# Patient Record
Sex: Female | Born: 1935 | Race: Black or African American | Hispanic: No | Smoking: Never smoker
Health system: Southern US, Community
[De-identification: ages and names within clinical notes are randomized; demographics above are authoritative.]

## PROBLEM LIST (undated history)

## (undated) DIAGNOSIS — F32A Depression, unspecified: Secondary | ICD-10-CM

## (undated) DIAGNOSIS — I10 Essential (primary) hypertension: Secondary | ICD-10-CM

## (undated) DIAGNOSIS — E785 Hyperlipidemia, unspecified: Secondary | ICD-10-CM

## (undated) DIAGNOSIS — F329 Major depressive disorder, single episode, unspecified: Secondary | ICD-10-CM

## (undated) DIAGNOSIS — C801 Malignant (primary) neoplasm, unspecified: Secondary | ICD-10-CM

## (undated) DIAGNOSIS — M1990 Unspecified osteoarthritis, unspecified site: Secondary | ICD-10-CM

---

## 1999-05-02 ENCOUNTER — Ambulatory Visit (HOSPITAL_COMMUNITY): Admission: RE | Admit: 1999-05-02 | Discharge: 1999-05-02 | Payer: Self-pay | Admitting: Family Medicine

## 1999-05-02 ENCOUNTER — Encounter: Payer: Self-pay | Admitting: Family Medicine

## 2000-12-11 ENCOUNTER — Other Ambulatory Visit: Admission: RE | Admit: 2000-12-11 | Discharge: 2000-12-11 | Payer: Self-pay | Admitting: Family Medicine

## 2000-12-24 ENCOUNTER — Ambulatory Visit (HOSPITAL_COMMUNITY): Admission: RE | Admit: 2000-12-24 | Discharge: 2000-12-24 | Payer: Self-pay | Admitting: Family Medicine

## 2001-08-18 ENCOUNTER — Ambulatory Visit (HOSPITAL_COMMUNITY): Admission: RE | Admit: 2001-08-18 | Discharge: 2001-08-18 | Payer: Self-pay | Admitting: Family Medicine

## 2002-05-29 ENCOUNTER — Emergency Department (HOSPITAL_COMMUNITY): Admission: EM | Admit: 2002-05-29 | Discharge: 2002-05-29 | Payer: Self-pay | Admitting: Emergency Medicine

## 2003-01-31 ENCOUNTER — Encounter (INDEPENDENT_AMBULATORY_CARE_PROVIDER_SITE_OTHER): Payer: Self-pay | Admitting: Family Medicine

## 2003-01-31 ENCOUNTER — Other Ambulatory Visit: Admission: RE | Admit: 2003-01-31 | Discharge: 2003-01-31 | Payer: Self-pay | Admitting: Family Medicine

## 2003-01-31 LAB — CONVERTED CEMR LAB: Pap Smear: NORMAL

## 2003-02-14 ENCOUNTER — Ambulatory Visit (HOSPITAL_COMMUNITY): Admission: RE | Admit: 2003-02-14 | Discharge: 2003-02-14 | Payer: Self-pay | Admitting: Family Medicine

## 2003-04-08 ENCOUNTER — Emergency Department (HOSPITAL_COMMUNITY): Admission: EM | Admit: 2003-04-08 | Discharge: 2003-04-08 | Payer: Self-pay | Admitting: Emergency Medicine

## 2003-11-22 ENCOUNTER — Ambulatory Visit: Payer: Self-pay | Admitting: Family Medicine

## 2004-02-22 ENCOUNTER — Ambulatory Visit: Payer: Self-pay | Admitting: Family Medicine

## 2004-08-29 ENCOUNTER — Ambulatory Visit: Payer: Self-pay | Admitting: Family Medicine

## 2005-03-05 ENCOUNTER — Ambulatory Visit: Payer: Self-pay | Admitting: Family Medicine

## 2005-04-03 ENCOUNTER — Ambulatory Visit: Payer: Self-pay | Admitting: Family Medicine

## 2005-04-09 ENCOUNTER — Ambulatory Visit: Payer: Self-pay | Admitting: Family Medicine

## 2005-04-25 ENCOUNTER — Ambulatory Visit: Payer: Self-pay | Admitting: Family Medicine

## 2005-05-19 ENCOUNTER — Ambulatory Visit: Payer: Self-pay | Admitting: Family Medicine

## 2005-05-29 ENCOUNTER — Encounter (INDEPENDENT_AMBULATORY_CARE_PROVIDER_SITE_OTHER): Payer: Self-pay | Admitting: Specialist

## 2005-05-29 ENCOUNTER — Ambulatory Visit (HOSPITAL_BASED_OUTPATIENT_CLINIC_OR_DEPARTMENT_OTHER): Admission: RE | Admit: 2005-05-29 | Discharge: 2005-05-29 | Payer: Self-pay | Admitting: Ophthalmology

## 2005-07-01 ENCOUNTER — Ambulatory Visit: Payer: Self-pay | Admitting: Family Medicine

## 2005-08-18 ENCOUNTER — Ambulatory Visit: Payer: Self-pay | Admitting: Family Medicine

## 2005-08-28 ENCOUNTER — Ambulatory Visit: Payer: Self-pay | Admitting: Family Medicine

## 2005-10-30 ENCOUNTER — Ambulatory Visit: Payer: Self-pay | Admitting: Family Medicine

## 2005-11-04 ENCOUNTER — Ambulatory Visit: Payer: Self-pay | Admitting: Family Medicine

## 2006-01-13 ENCOUNTER — Ambulatory Visit: Payer: Self-pay | Admitting: Family Medicine

## 2006-01-25 ENCOUNTER — Emergency Department (HOSPITAL_COMMUNITY): Admission: EM | Admit: 2006-01-25 | Discharge: 2006-01-25 | Payer: Self-pay | Admitting: Emergency Medicine

## 2006-02-12 ENCOUNTER — Ambulatory Visit: Payer: Self-pay | Admitting: Family Medicine

## 2006-03-19 ENCOUNTER — Ambulatory Visit: Payer: Self-pay | Admitting: Family Medicine

## 2006-07-11 ENCOUNTER — Encounter (INDEPENDENT_AMBULATORY_CARE_PROVIDER_SITE_OTHER): Payer: Self-pay | Admitting: Family Medicine

## 2006-07-11 DIAGNOSIS — I10 Essential (primary) hypertension: Secondary | ICD-10-CM | POA: Insufficient documentation

## 2006-07-11 DIAGNOSIS — F209 Schizophrenia, unspecified: Secondary | ICD-10-CM | POA: Insufficient documentation

## 2006-07-11 DIAGNOSIS — E119 Type 2 diabetes mellitus without complications: Secondary | ICD-10-CM

## 2006-07-11 DIAGNOSIS — E785 Hyperlipidemia, unspecified: Secondary | ICD-10-CM | POA: Insufficient documentation

## 2006-07-11 DIAGNOSIS — Z8719 Personal history of other diseases of the digestive system: Secondary | ICD-10-CM | POA: Insufficient documentation

## 2006-07-20 ENCOUNTER — Ambulatory Visit: Payer: Self-pay | Admitting: Family Medicine

## 2006-10-19 ENCOUNTER — Emergency Department (HOSPITAL_COMMUNITY): Admission: EM | Admit: 2006-10-19 | Discharge: 2006-10-19 | Payer: Self-pay | Admitting: Emergency Medicine

## 2006-10-22 ENCOUNTER — Ambulatory Visit: Payer: Self-pay | Admitting: Cardiology

## 2006-11-11 ENCOUNTER — Ambulatory Visit: Payer: Self-pay | Admitting: Family Medicine

## 2006-11-11 LAB — CONVERTED CEMR LAB
ALT: 13 units/L (ref 0–35)
Chloride: 104 meq/L (ref 96–112)
Creatinine, Ser: 0.93 mg/dL (ref 0.40–1.20)
Eosinophils Absolute: 0 10*3/uL (ref 0.0–0.7)
MCHC: 31.6 g/dL (ref 30.0–36.0)
MCV: 95 fL (ref 78.0–100.0)
Neutro Abs: 1.5 10*3/uL — ABNORMAL LOW (ref 1.7–7.7)
Neutrophils Relative %: 47 % (ref 43–77)
Potassium: 3.9 meq/L (ref 3.5–5.3)
RBC: 4.76 M/uL (ref 3.87–5.11)
Sodium: 142 meq/L (ref 135–145)
Total Protein: 7.1 g/dL (ref 6.0–8.3)
WBC: 3.2 10*3/uL — ABNORMAL LOW (ref 4.0–10.5)

## 2006-11-26 ENCOUNTER — Ambulatory Visit: Payer: Self-pay | Admitting: Family Medicine

## 2006-12-08 ENCOUNTER — Ambulatory Visit: Payer: Self-pay | Admitting: Cardiology

## 2006-12-08 ENCOUNTER — Ambulatory Visit: Payer: Self-pay

## 2006-12-08 LAB — CONVERTED CEMR LAB
Cholesterol: 230 mg/dL (ref 0–200)
HDL: 36.5 mg/dL — ABNORMAL LOW (ref >39.0)

## 2007-02-26 ENCOUNTER — Ambulatory Visit: Payer: Self-pay | Admitting: Family Medicine

## 2007-02-26 LAB — CONVERTED CEMR LAB
HDL: 48 mg/dL (ref >39)
LDL Cholesterol: 155 mg/dL — ABNORMAL HIGH (ref 0–99)
Triglycerides: 116 mg/dL (ref <150)

## 2007-09-29 ENCOUNTER — Emergency Department (HOSPITAL_COMMUNITY): Admission: EM | Admit: 2007-09-29 | Discharge: 2007-09-29 | Payer: Self-pay | Admitting: Family Medicine

## 2007-10-06 ENCOUNTER — Ambulatory Visit: Payer: Self-pay | Admitting: Internal Medicine

## 2007-10-08 ENCOUNTER — Ambulatory Visit (HOSPITAL_COMMUNITY): Admission: RE | Admit: 2007-10-08 | Discharge: 2007-10-08 | Payer: Self-pay | Admitting: Family Medicine

## 2008-01-18 ENCOUNTER — Ambulatory Visit: Payer: Self-pay | Admitting: Internal Medicine

## 2008-08-28 ENCOUNTER — Ambulatory Visit: Payer: Self-pay | Admitting: Internal Medicine

## 2008-08-30 ENCOUNTER — Emergency Department (HOSPITAL_COMMUNITY): Admission: EM | Admit: 2008-08-30 | Discharge: 2008-08-30 | Payer: Self-pay | Admitting: Emergency Medicine

## 2008-09-01 ENCOUNTER — Emergency Department (HOSPITAL_COMMUNITY): Admission: EM | Admit: 2008-09-01 | Discharge: 2008-09-01 | Payer: Self-pay | Admitting: Emergency Medicine

## 2008-09-04 ENCOUNTER — Ambulatory Visit: Payer: Self-pay | Admitting: Internal Medicine

## 2008-09-04 ENCOUNTER — Encounter: Payer: Self-pay | Admitting: Family Medicine

## 2008-09-04 LAB — CONVERTED CEMR LAB: Microalb, Ur: 1.5 mg/dL (ref 0.00–1.89)

## 2008-09-27 ENCOUNTER — Ambulatory Visit: Payer: Self-pay | Admitting: Internal Medicine

## 2008-11-24 ENCOUNTER — Telehealth (INDEPENDENT_AMBULATORY_CARE_PROVIDER_SITE_OTHER): Payer: Self-pay | Admitting: *Deleted

## 2008-11-27 ENCOUNTER — Ambulatory Visit: Payer: Self-pay | Admitting: Internal Medicine

## 2008-12-21 ENCOUNTER — Ambulatory Visit: Payer: Self-pay | Admitting: Family Medicine

## 2009-02-19 IMAGING — CT CT ANGIO CHEST
3 of 5 series · 18 of 30 positions shown · IV contrast (100 ML OMNI 300)
Comparison: Plain film same date.

CLINICAL DATA: Left-sided chest pain. Diaphoresis.

CT ANGIOGRAPHY OF CHEST - PULMONARY EMBOLISM PROTOCOL
TECHNIQUE: Multidetector CT imaging of the chest was performed according to the
protocol for detection of pulmonary embolism during bolus injection of
intravenous contrast.  Coronal and sagittal plane CT angiographic image
reconstructions were also generated.
Contrast:  100 cc Omnipaque 300

[Series 3: pe · axial · 0.67mm/px · z∈[-493,-302]mm · 10 of 193 slices shown]
[im 20/193  lung]
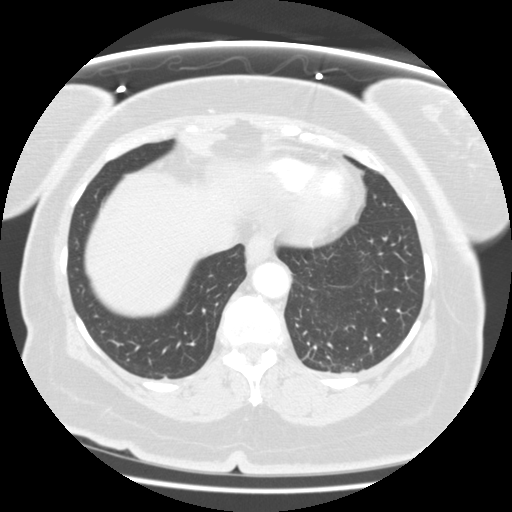
[im 39/193  mediastinal]
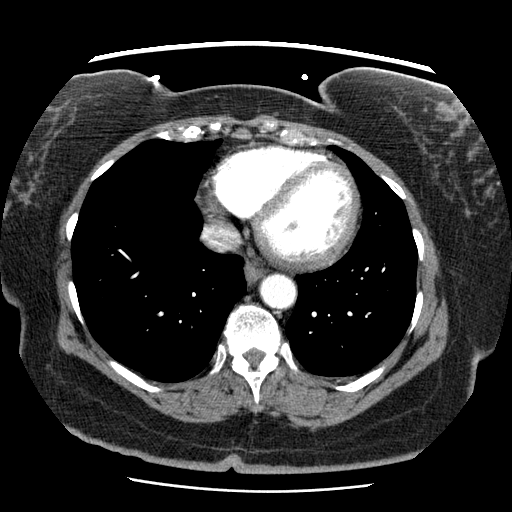
[im 58/193  lung]
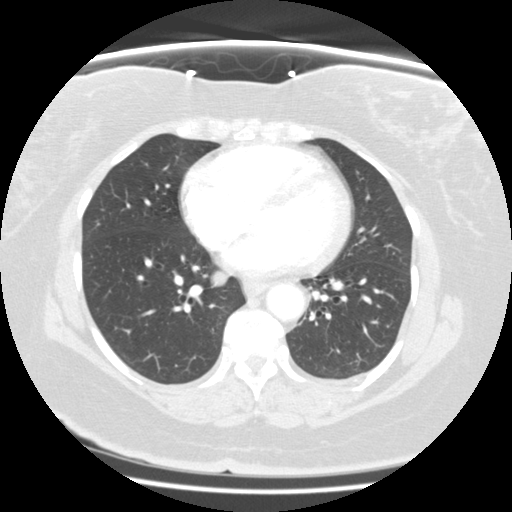
[im 77/193  mediastinal]
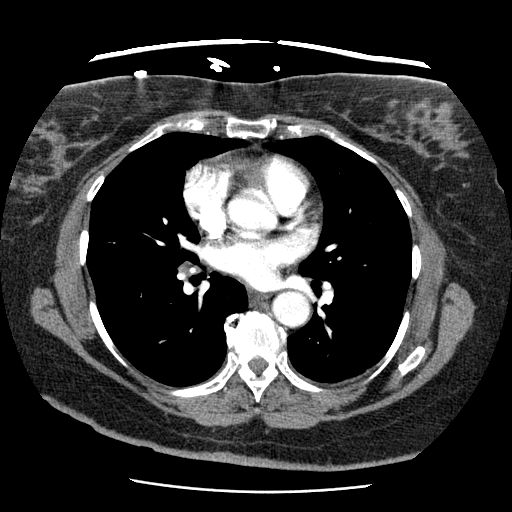
[im 97/193  lung]
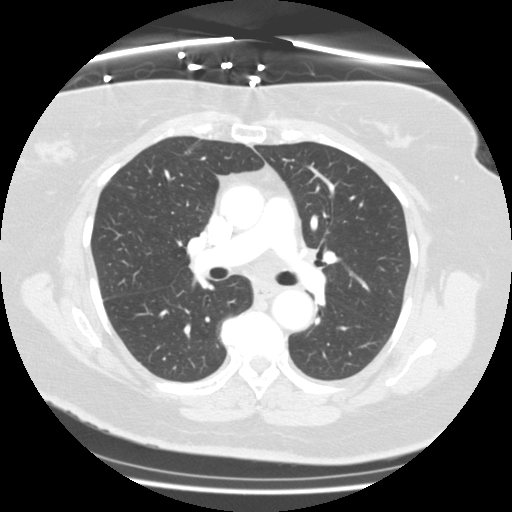
[im 109/193  mediastinal]
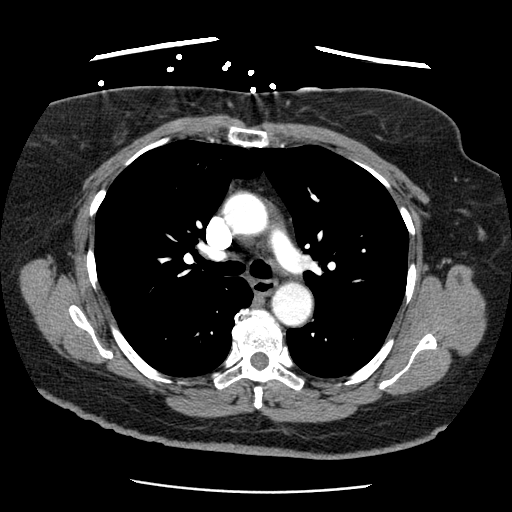
[im 116/193  lung]
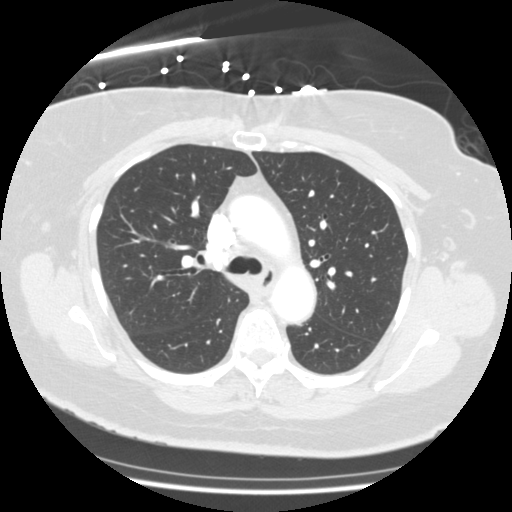
[im 135/193  mediastinal]
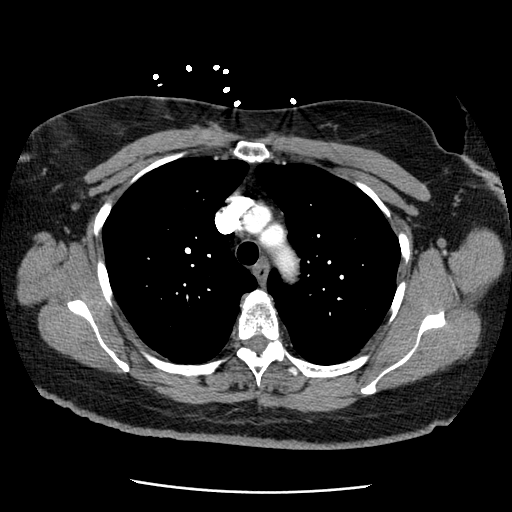
[im 154/193  lung]
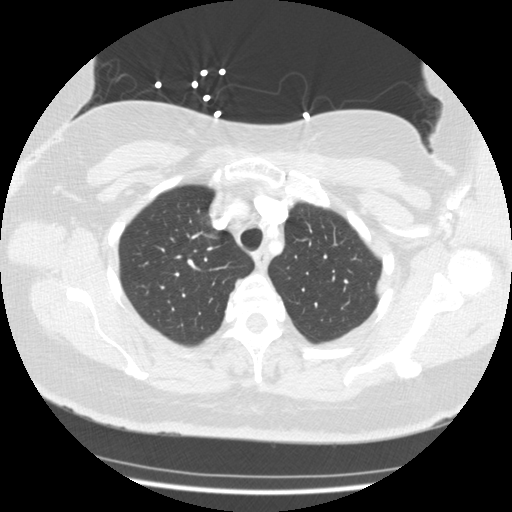
[im 173/193  mediastinal]
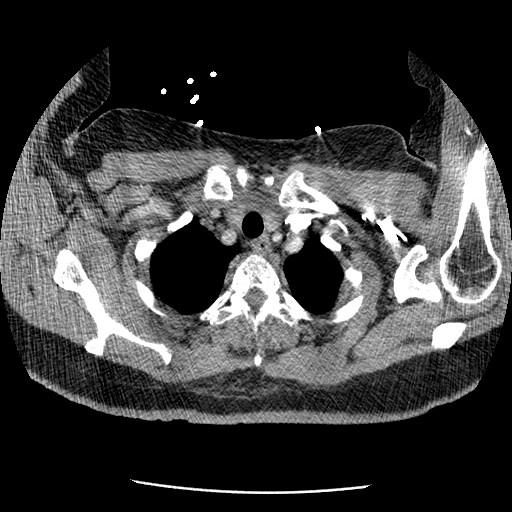

[Series 4: recon 2: pe · axial · 0.67mm/px · z∈[-456,-336]mm · 4 of 97 slices shown]
[im 25/97  lung]
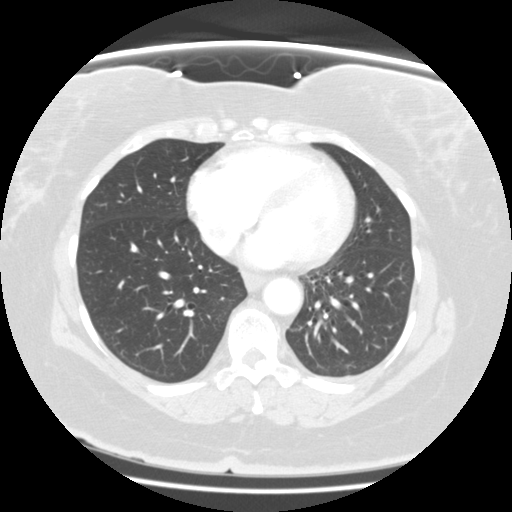
[im 49/97  lung]
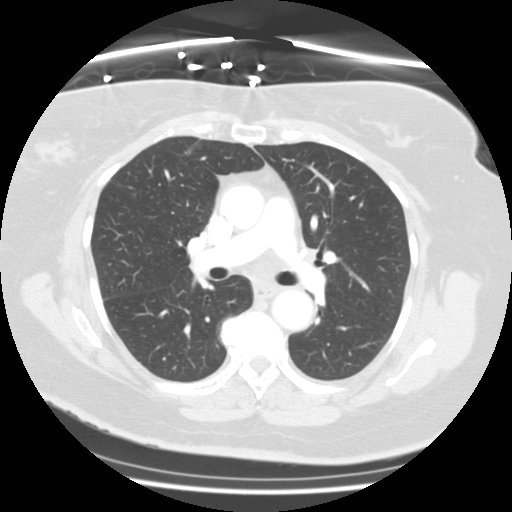
[im 55/97  lung]
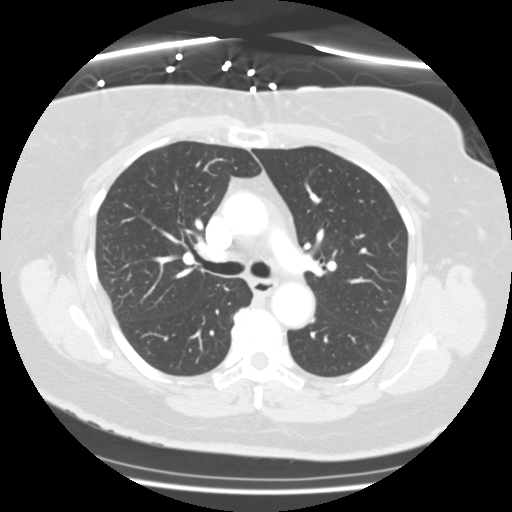
[im 73/97  lung]
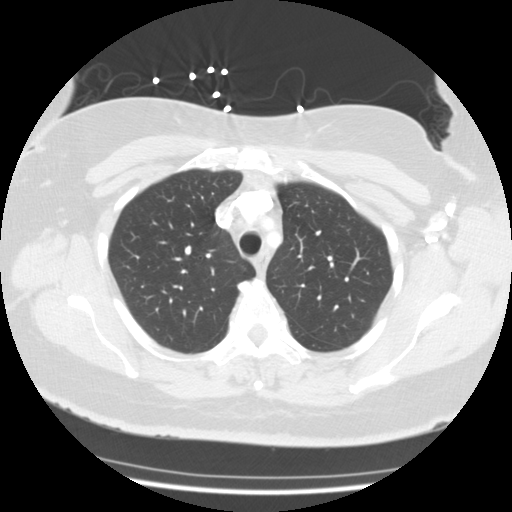

[Series 300: sag chest · sagittal · 0.66mm/px · 4 of 117 slices shown]
[im 24/117  lung]
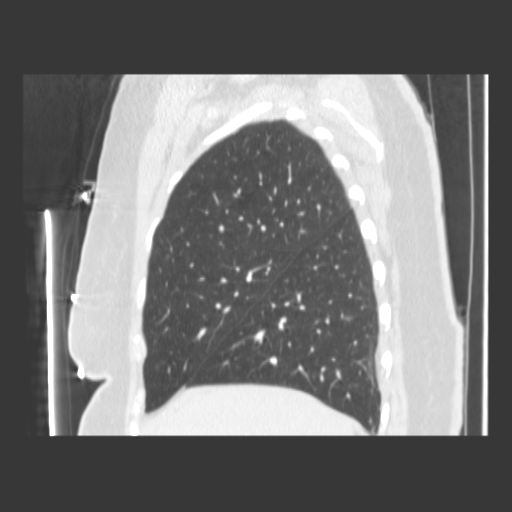
[im 47/117  lung]
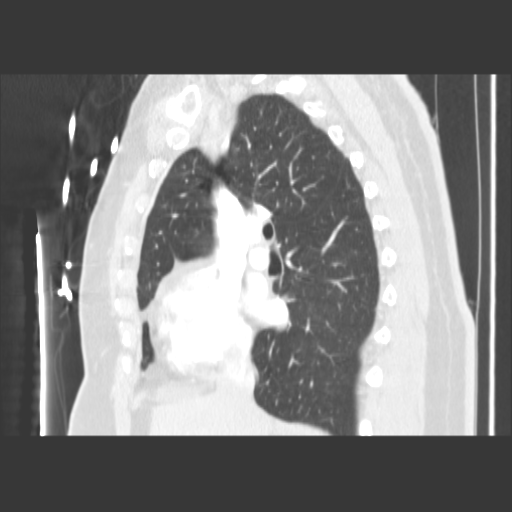
[im 70/117  lung]
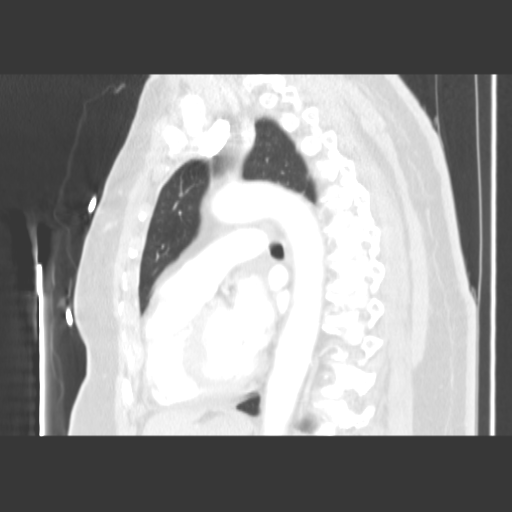
[im 93/117  lung]
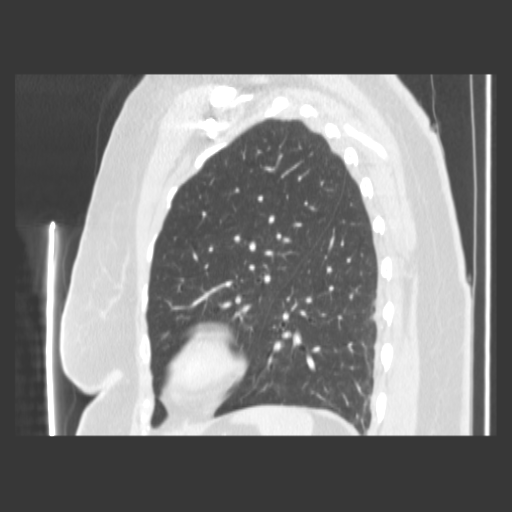

[18 of 30 positions shown; findings below may reference images not displayed]

FINDINGS: Lung windows demonstrate 1-2 mm right middle lobe lung nodule on image
59. Costophrenic angles partially excluded.

Minimal motion artifact involving the lung bases. Probable 1-2 mm lingular
nodule on image 89.

Soft tissue windows:  The quality of this exam for evaluation of pulmonary
embolism is good .

No evidence of embolism.

Normal aortic caliber without evidence of dissection. Normal heart size no
pericardial or pleural effusion.

No mediastinal or hilar adenopathy. Mildly prominent infrahilar nodal tissue
bilaterally. Tiny hiatal hernia.

No acute osseous abnormality. Moderate thoracic spondylosis.

IMPRESSION

1. No evidence of pulmonary embolism.
2. No other explanation for left-sided chest pain.
3. Tiny hiatal hernia. 
4. Tiny bilateral lung nodules. Presuming no history of malignancy or smoking,
these are most likely benign nodules. If there is such a history, followup chest
CT in one year should be considered.

## 2009-04-19 ENCOUNTER — Ambulatory Visit: Payer: Self-pay | Admitting: Family Medicine

## 2009-04-20 ENCOUNTER — Ambulatory Visit (HOSPITAL_COMMUNITY): Admission: RE | Admit: 2009-04-20 | Discharge: 2009-04-20 | Payer: Self-pay | Admitting: Family Medicine

## 2009-08-29 ENCOUNTER — Encounter (INDEPENDENT_AMBULATORY_CARE_PROVIDER_SITE_OTHER): Payer: Self-pay | Admitting: Adult Health

## 2009-08-29 ENCOUNTER — Ambulatory Visit: Payer: Self-pay | Admitting: Family Medicine

## 2009-08-29 LAB — CONVERTED CEMR LAB
ALT: 19 units/L (ref 0–35)
AST: 25 units/L (ref 0–37)
Albumin: 4.7 g/dL (ref 3.5–5.2)
Basophils Relative: 1 % (ref 0–1)
CO2: 27 meq/L (ref 19–32)
Chloride: 101 meq/L (ref 96–112)
Creatinine, Ser: 0.8 mg/dL (ref 0.40–1.20)
Eosinophils Absolute: 0 10*3/uL (ref 0.0–0.7)
Eosinophils Relative: 0 % (ref 0–5)
Glucose, Bld: 157 mg/dL — ABNORMAL HIGH (ref 70–99)
Lymphocytes Relative: 40 % (ref 12–46)
Lymphs Abs: 1.8 10*3/uL (ref 0.7–4.0)
Monocytes Absolute: 0.5 10*3/uL (ref 0.1–1.0)
Monocytes Relative: 11 % (ref 3–12)
Neutro Abs: 2.2 10*3/uL (ref 1.7–7.7)
Platelets: 244 10*3/uL (ref 150–400)
RDW: 13.8 % (ref 11.5–15.5)
Sodium: 141 meq/L (ref 135–145)
Total Protein: 7.8 g/dL (ref 6.0–8.3)
WBC: 4.5 10*3/uL (ref 4.0–10.5)

## 2009-09-02 ENCOUNTER — Emergency Department (HOSPITAL_COMMUNITY): Admission: EM | Admit: 2009-09-02 | Discharge: 2009-09-02 | Payer: Self-pay | Admitting: Emergency Medicine

## 2009-09-06 ENCOUNTER — Ambulatory Visit: Payer: Self-pay | Admitting: Family Medicine

## 2009-10-23 ENCOUNTER — Ambulatory Visit: Payer: Self-pay | Admitting: Internal Medicine

## 2009-11-20 ENCOUNTER — Ambulatory Visit: Payer: Self-pay | Admitting: Internal Medicine

## 2010-04-27 LAB — CBC
Hemoglobin: 14.5 g/dL (ref 12.0–15.0)
MCHC: 34 g/dL (ref 30.0–36.0)
MCV: 92.6 fL (ref 78.0–100.0)
Platelets: 227 10*3/uL (ref 150–400)
RBC: 4.6 MIL/uL (ref 3.87–5.11)
WBC: 4.8 10*3/uL (ref 4.0–10.5)

## 2010-04-27 LAB — COMPREHENSIVE METABOLIC PANEL
ALT: 23 U/L (ref 0–35)
AST: 38 U/L — ABNORMAL HIGH (ref 0–37)
Albumin: 3.9 g/dL (ref 3.5–5.2)
BUN: 6 mg/dL (ref 6–23)
Calcium: 9.2 mg/dL (ref 8.4–10.5)
Creatinine, Ser: 0.9 mg/dL (ref 0.4–1.2)
GFR calc Af Amer: >60 mL/min (ref >60)
GFR calc non Af Amer: >60 mL/min (ref >60)
Potassium: 3.8 mEq/L (ref 3.5–5.1)
Sodium: 139 mEq/L (ref 135–145)
Total Protein: 7.4 g/dL (ref 6.0–8.3)

## 2010-04-27 LAB — PROTIME-INR: Prothrombin Time: 13.6 seconds (ref 11.6–15.2)

## 2010-04-27 LAB — POCT CARDIAC MARKERS
CKMB, poc: <1 ng/mL — ABNORMAL LOW (ref 1.0–8.0)
Myoglobin, poc: 59.7 ng/mL (ref 12–200)

## 2010-04-27 LAB — URINALYSIS, ROUTINE W REFLEX MICROSCOPIC
Hgb urine dipstick: NEGATIVE
Protein, ur: NEGATIVE mg/dL
Urobilinogen, UA: 0.2 mg/dL (ref 0.0–1.0)

## 2010-06-24 ENCOUNTER — Other Ambulatory Visit (HOSPITAL_COMMUNITY): Payer: Self-pay | Admitting: Family Medicine

## 2010-06-24 DIAGNOSIS — Z1231 Encounter for screening mammogram for malignant neoplasm of breast: Secondary | ICD-10-CM

## 2010-06-25 NOTE — Procedures (Signed)
[facility]                              EXERCISE TREADMILL   NAME:Michelle Macdonald, Michelle Macdonald                   MRN:          [id number]  DATE:12/08/2006                            DOB:          25-Aug-1935    PRIMARY:  Dr. Dow Adolph.   PROCEDURE:  Exercise treadmill test.   INDICATION:  Evaluate patient with chest discomfort.   PROCEDURE NOTE:  The patient was exercised using standard Bruce  protocol.  She was only able to exercise for 4 minutes, which was 1  minute in stage II.  Test was terminated because of fatigue.  She did  achieve her target heart rate.  Her peak heart rate was 166, which was  109% of predicted.  She had an appropriate blood pressure response that  was slightly elevated at 187/94 peak.  She had no chest pain.  She had  no ischemic ST-T wave changes.  There were no arrhythmias.  She did not  have a normal heart rate recovery.   CONCLUSION:  Negative adequate exercise treadmill test.  However, the  patient is not in the lowest risk category.  She has a poor exercise  tolerance and an abnormal heart rate recovery.   PLAN:  Based on the above, no further cardiovascular testing is  suggested.  She should continue with primary risk reduction.  I do not  see any evidence of high grade obstructive coronary artery disease.  However, I do think she is at risk and needs, in particular, exercise  training.  Based on the above study, I gave her prescription for  exercise.  She can follow with Dr. Audria Nine.     Rollene Rotunda, MD, [facility]  Electronically Signed    JH/MedQ  DD: 12/08/2006  DT: 12/08/2006  Job #: [id number]   cc:   Maurice March, M.D.

## 2010-06-25 NOTE — Assessment & Plan Note (Signed)
[facility]                            CARDIOLOGY OFFICE NOTE   NAME:Michelle Macdonald, Michelle Macdonald                   MRN:          [id number]  DATE:10/22/2006                            DOB:          [birth year]    PRIMARY CARE PHYSICIAN:  Dr. Dow Adolph.   REFERRED BY:  Dr. Cathren Laine.   REASON FOR REFERRAL:  Evaluate patient with chest pain.   HISTORY OF PRESENT ILLNESS:  The patient is a lovely 75 year old African-  American female who was seen in the emergency room on 9/8 with  complaints of chest discomfort. The patient had this start the day or so  before. She noticed it throughout the day. It was a discomfort under her  left breast. It would come and go. It was a shooting discomfort aiming  down from the sternum to the axilla. She presented to the urgent care  and was referred to the emergency room. She did have an elevated D-Dimer  of 1.80. However, CT demonstrated no evidence of a pulmonary embolism or  other acute abnormality. She denies any associated nausea, vomiting, or  diaphoresis. She has had no palpitations, pre-syncope, or syncope. She  started taking Motrin and she thinks that the symptoms have improved and  are almost gone now. She does walk stairs in her apartment and cleans  her house. With this, she cannot bring on any of these symptoms. She has  not had these symptoms before. The discomfort at peak a 7 out of 10. It  did not radiate to her jaw or to her arms.   PAST MEDICAL HISTORY:  1. Hypertension, diagnosed this year.  2. Diabetes mellitus, diagnosed this year.   PAST SURGICAL HISTORY:  None.   ALLERGIES:  None.   CURRENT MEDICATIONS:  1. Thorazine 75 mg nightly.  2. Metformin 250 mg b.i.d.  3. Clonidine 0.1 mg nightly.   SOCIAL HISTORY:  The patient is retired. She is separated. She has three  children, five grandchildren, and three great-grandchildren. She has  never smoked cigarettes. She does not drink  alcohol.   FAMILY HISTORY:  Contributory for early coronary disease with her sister  dying of a myocardial infarction at age 23. She does not know her  father's history. Her mother died at age 76 with sudden myocardial  infarction.   REVIEW OF SYSTEMS:  As stated in the HPI and positive for reflux,  previous ulcer. Negative for other symptoms.   PHYSICAL EXAMINATION:  GENERAL:  The patient is in no acute distress.  VITAL SIGNS:  Blood pressure 140/82, heart rate 90 and regular, weight  172 pounds, body mass index 32.  HEENT:  Eyelids unremarkable, pupils equal, round, and reactive to  light, fundi not visualized, oral mucosa unremarkable.  NECK:  No jugular vein distension at 45 degrees, carotid upstroke brisk  and symmetric, no thyromegaly.  LYMPHATICS:  No cervical, axillary, or inguinal adenopathy.  LUNGS:  Clear to auscultation bilaterally.  BACK:  No costovertebral angle tenderness.  CHEST:  Unremarkable.  HEART:  PMI not displaced or sustained, S1 and S2 within normal limits,  no S3,  no S4 no clicks, rubs, or murmurs.  ABDOMEN:  Obese, positive bowel sounds, normal to frequency and pitch,  no bruits, no midline pulsatile mass, no hepatomegaly, no splenomegaly.  SKIN:  No rashes, no nodules.  EXTREMITIES:  2+ pulses throughout, no cyanosis, clubbing, or edema.  NEUROLOGIC:  Oriented to person, place, and time. Cranial nerves II-XII  are grossly intact, motor grossly intact.   EKG:  Sinus rhythm, rate 90, axis within normal limits, intervals within  normal limits, non-specific T-wave flattening, no acute STT wave  changes.   ASSESSMENT AND PLAN:  1. Chest. The patient's chest discomfort is atypical. However, she      does have cardiovascular risk factors. I think that her pretest      probability of obstructive coronary disease is low-moderate. I do      think that she needs to be screened with an exercise treadmill      test. She will be given prescription for exercise, as  well as risk      stratification with this.  2. Risk reduction. When she comes back she will get a lipid profile. I      would have a low threshold to manage her aggressively given her      diabetes.  3. Obesity. We will discuss weight loss with diet and exercise.  4. Follow up will be at the time of her treadmill.     Rollene Rotunda, MD, [facility]  Electronically Signed    JH/MedQ  DD: 10/22/2006  DT: 10/23/2006  Job #: [id number]   cc:   Maurice March, M.D.  Trudi Ida Denton Lank, M.D.

## 2010-07-04 ENCOUNTER — Ambulatory Visit (HOSPITAL_COMMUNITY)
Admission: RE | Admit: 2010-07-04 | Discharge: 2010-07-04 | Disposition: A | Discharge status: Home / Self Care | Payer: Medicare Other | Source: Ambulatory Visit | Attending: Family Medicine | Admitting: Family Medicine

## 2010-07-04 DIAGNOSIS — Z1231 Encounter for screening mammogram for malignant neoplasm of breast: Secondary | ICD-10-CM

## 2010-11-22 LAB — COMPREHENSIVE METABOLIC PANEL
ALT: 17
Albumin: 4
CO2: 27
Calcium: 9.8
Chloride: 105
GFR calc non Af Amer: 58 — ABNORMAL LOW
Potassium: 4.2

## 2010-11-22 LAB — CBC
MCV: 91
RDW: 13.9

## 2010-11-22 LAB — DIFFERENTIAL
Basophils Absolute: 0
Basophils Relative: 1
Eosinophils Absolute: 0
Lymphocytes Relative: 32
Lymphs Abs: 1.4
Neutro Abs: 2.4

## 2010-11-22 LAB — D-DIMER, QUANTITATIVE: D-Dimer, Quant: 1.8 — ABNORMAL HIGH

## 2011-08-03 ENCOUNTER — Emergency Department (HOSPITAL_COMMUNITY): Payer: Medicare Other

## 2011-08-03 ENCOUNTER — Encounter (HOSPITAL_COMMUNITY): Payer: Self-pay | Admitting: Emergency Medicine

## 2011-08-03 ENCOUNTER — Emergency Department (HOSPITAL_COMMUNITY)
Admission: EM | Admit: 2011-08-03 | Discharge: 2011-08-03 | Disposition: A | Discharge status: Home / Self Care | Payer: Medicare Other | Attending: Emergency Medicine | Admitting: Emergency Medicine

## 2011-08-03 DIAGNOSIS — R000 Tachycardia, unspecified: Secondary | ICD-10-CM | POA: Insufficient documentation

## 2011-08-03 DIAGNOSIS — I10 Essential (primary) hypertension: Secondary | ICD-10-CM | POA: Insufficient documentation

## 2011-08-03 DIAGNOSIS — R059 Cough, unspecified: Secondary | ICD-10-CM | POA: Insufficient documentation

## 2011-08-03 DIAGNOSIS — R509 Fever, unspecified: Secondary | ICD-10-CM

## 2011-08-03 DIAGNOSIS — R05 Cough: Secondary | ICD-10-CM | POA: Insufficient documentation

## 2011-08-03 DIAGNOSIS — E119 Type 2 diabetes mellitus without complications: Secondary | ICD-10-CM | POA: Insufficient documentation

## 2011-08-03 HISTORY — DX: Essential (primary) hypertension: I10

## 2011-08-03 LAB — BASIC METABOLIC PANEL
BUN: 9 mg/dL (ref 6–23)
Chloride: 103 mEq/L (ref 96–112)
Glucose, Bld: 123 mg/dL — ABNORMAL HIGH (ref 70–99)
Potassium: 3.7 mEq/L (ref 3.5–5.1)

## 2011-08-03 LAB — CBC
HCT: 40.3 % (ref 36.0–46.0)
Hemoglobin: 13.3 g/dL (ref 12.0–15.0)
RBC: 4.34 MIL/uL (ref 3.87–5.11)
WBC: 9.7 10*3/uL (ref 4.0–10.5)

## 2011-08-03 MED ORDER — SODIUM CHLORIDE 0.9 % IV BOLUS (SEPSIS)
1000.0000 mL | Freq: Once | INTRAVENOUS | Status: AC
  Administered 2011-08-03: 1000 mL via INTRAVENOUS

## 2011-08-03 MED ORDER — ACETAMINOPHEN 325 MG PO TABS
650.0000 mg | ORAL_TABLET | Freq: Once | ORAL | Status: AC
  Administered 2011-08-03: 650 mg via ORAL
  Filled 2011-08-03: qty 2

## 2011-08-03 NOTE — ED Notes (Signed)
Pt w/ onset of nasal stuffiness, dizziness, sore throat, cough yesterday morning.. Denies N/V/D. Productive cough clear phlegm, nasal drainage is also clear.

## 2011-08-03 NOTE — ED Notes (Signed)
Pt verbalizes understanding 

## 2011-08-03 NOTE — ED Provider Notes (Signed)
History     CSN: [id number]  Arrival date & time 08/03/11  1621   First MD Initiated Contact with Patient 08/03/11 1657      Chief Complaint  Patient presents with  . Cough     The history is provided by the patient.   patient reports nasal congestion cough and sore throat since yesterday.  She's had no nausea vomiting or diarrhea.  She denies chest pain or shortness of breath.  She has had some lightheadedness when she stands up.  She denies weakness of her upper lower extremities.  She has had productive cough.  She's had chills without documented fever.  She denies urinary frequency or dysuria.  Past Medical History  Diagnosis Date  . Hypertension   . Diabetes mellitus     History reviewed. No pertinent past surgical history.  No family history on file.  History  Substance Use Topics  . Smoking status: Never Smoker   . Smokeless tobacco: Not on file  . Alcohol Use: No    OB History    Grav Para Term Preterm Abortions TAB SAB Ect Mult Living                  Review of Systems  Respiratory: Positive for cough.   All other systems reviewed and are negative.    Allergies  Review of patient's allergies indicates not on file.  Home Medications   Current Outpatient Rx  Name Route Sig Dispense Refill  . GLIMEPIRIDE 2 MG PO TABS Oral Take 2 mg by mouth daily before breakfast.    . LISINOPRIL 5 MG PO TABS Oral Take 5 mg by mouth daily.    Marland Kitchen METFORMIN HCL 500 MG PO TABS Oral Take 500 mg by mouth 2 (two) times daily with a meal.    . THIORIDAZINE HCL 25 MG PO TABS Oral Take 25 mg by mouth 3 (three) times daily.      BP 121/59  Pulse 82  Temp 98.7 F (37.1 C) (Oral)  Resp 16  Wt 174 lb (78.926 kg)  SpO2 96%  Physical Exam  Nursing note and vitals reviewed. Constitutional: She is oriented to person, place, and time. She appears well-developed and well-nourished. No distress.  HENT:  Head: Normocephalic and atraumatic.  Eyes: EOM are normal.  Neck: Normal  range of motion.  Cardiovascular: Regular rhythm and normal heart sounds.        Tachycardia  Pulmonary/Chest: Effort normal and breath sounds normal.  Abdominal: Soft. She exhibits no distension. There is no tenderness.  Musculoskeletal: Normal range of motion.  Neurological: She is alert and oriented to person, place, and time.  Skin: Skin is warm and dry.  Psychiatric: She has a normal mood and affect. Judgment normal.    ED Course  Procedures (including critical care time)  Labs Reviewed  BASIC METABOLIC PANEL - Abnormal; Notable for the following:    Glucose, Bld 123 (*)     GFR calc non Af Amer 57 (*)     GFR calc Af Amer 66 (*)     All other components within normal limits  GLUCOSE, CAPILLARY - Abnormal; Notable for the following:    Glucose-Capillary 122 (*)     All other components within normal limits  CBC   Dg Chest 2 View  08/03/2011  *RADIOLOGY REPORT*  Clinical Data: 76 year old female with cough congestion and fever. Hypertension and diabetes.  CHEST - 2 VIEW  Comparison: 08/30/2008 and earlier.  Findings: AP and  lateral views of the chest.  Stable lung volumes. The stable prominent right heart border compatible with a degree of right heart enlargement. Other mediastinal contours are within normal limits.  Visualized tracheal air column is within normal limits.  No pneumothorax, pulmonary edema, pleural effusion or acute pulmonary opacity. No acute osseous abnormality identified.  IMPRESSION: No acute cardiopulmonary abnormality.  Original Report Authenticated By: Harley Hallmark, M.D.    I personally reviewed the imaging tests through PACS system  I reviewed available ER/hospitalization records thought the EMR   1. Fever       MDM  The patient is well-appearing.  She feels much better after antipyretics and IV fluids.  She ambulated in the room without any difficulty or lightheadedness.  Chest x-ray is clear.  Labs otherwise without significant abnormalities.   Discharge home with close PCP followup.  I think the majority of this is viral upper respiratory tract infection/bronchitis        Lyanne Co, MD 08/03/11 2033

## 2011-08-03 NOTE — ED Notes (Signed)
Patient transported to X-ray 

## 2011-08-03 NOTE — ED Notes (Signed)
Pt states since yesterday she's had dizziness, cough, nasal congestion, denies n/v/d, pt does have a fever, states feels congested, coughing up clear mucus. 95% on RA.

## 2012-02-05 ENCOUNTER — Other Ambulatory Visit: Payer: Self-pay | Admitting: Internal Medicine

## 2012-02-05 DIAGNOSIS — Z1231 Encounter for screening mammogram for malignant neoplasm of breast: Secondary | ICD-10-CM

## 2012-02-19 ENCOUNTER — Ambulatory Visit (HOSPITAL_COMMUNITY)
Admission: RE | Admit: 2012-02-19 | Discharge: 2012-02-19 | Disposition: A | Discharge status: Home / Self Care | Payer: Medicare Other | Source: Ambulatory Visit | Attending: Internal Medicine | Admitting: Internal Medicine

## 2012-02-19 DIAGNOSIS — Z1231 Encounter for screening mammogram for malignant neoplasm of breast: Secondary | ICD-10-CM | POA: Insufficient documentation

## 2012-02-24 ENCOUNTER — Other Ambulatory Visit: Payer: Self-pay | Admitting: Internal Medicine

## 2012-02-24 DIAGNOSIS — R928 Other abnormal and inconclusive findings on diagnostic imaging of breast: Secondary | ICD-10-CM

## 2012-03-30 ENCOUNTER — Other Ambulatory Visit: Payer: Self-pay | Admitting: Internal Medicine

## 2012-03-30 ENCOUNTER — Ambulatory Visit
Admission: RE | Admit: 2012-03-30 | Discharge: 2012-03-30 | Disposition: A | Discharge status: Home / Self Care | Payer: Medicare Other | Source: Ambulatory Visit | Attending: Internal Medicine | Admitting: Internal Medicine

## 2012-03-30 DIAGNOSIS — R928 Other abnormal and inconclusive findings on diagnostic imaging of breast: Secondary | ICD-10-CM

## 2012-12-02 ENCOUNTER — Emergency Department (HOSPITAL_COMMUNITY): Payer: Medicare Other

## 2012-12-02 ENCOUNTER — Emergency Department (HOSPITAL_COMMUNITY)
Admission: EM | Admit: 2012-12-02 | Discharge: 2012-12-02 | Disposition: A | Discharge status: Home / Self Care | Payer: Medicare Other | Attending: Emergency Medicine | Admitting: Emergency Medicine

## 2012-12-02 ENCOUNTER — Encounter (HOSPITAL_COMMUNITY): Payer: Self-pay | Admitting: Emergency Medicine

## 2012-12-02 DIAGNOSIS — R110 Nausea: Secondary | ICD-10-CM | POA: Insufficient documentation

## 2012-12-02 DIAGNOSIS — Z79899 Other long term (current) drug therapy: Secondary | ICD-10-CM | POA: Insufficient documentation

## 2012-12-02 DIAGNOSIS — R42 Dizziness and giddiness: Secondary | ICD-10-CM | POA: Insufficient documentation

## 2012-12-02 DIAGNOSIS — Z7982 Long term (current) use of aspirin: Secondary | ICD-10-CM | POA: Insufficient documentation

## 2012-12-02 DIAGNOSIS — E119 Type 2 diabetes mellitus without complications: Secondary | ICD-10-CM | POA: Insufficient documentation

## 2012-12-02 DIAGNOSIS — I10 Essential (primary) hypertension: Secondary | ICD-10-CM | POA: Insufficient documentation

## 2012-12-02 LAB — BASIC METABOLIC PANEL
CO2: 24 mEq/L (ref 19–32)
Calcium: 9.9 mg/dL (ref 8.4–10.5)
Chloride: 100 mEq/L (ref 96–112)
Glucose, Bld: 251 mg/dL — ABNORMAL HIGH (ref 70–99)
Sodium: 136 mEq/L (ref 135–145)

## 2012-12-02 LAB — CBC WITH DIFFERENTIAL/PLATELET
Eosinophils Relative: 0 % (ref 0–5)
HCT: 41.7 % (ref 36.0–46.0)
Lymphocytes Relative: 19 % (ref 12–46)
Lymphs Abs: 1 10*3/uL (ref 0.7–4.0)
MCV: 92.5 fL (ref 78.0–100.0)
Monocytes Absolute: 0.4 10*3/uL (ref 0.1–1.0)
Neutro Abs: 4 10*3/uL (ref 1.7–7.7)
Platelets: 241 10*3/uL (ref 150–400)
RBC: 4.51 MIL/uL (ref 3.87–5.11)
WBC: 5.4 10*3/uL (ref 4.0–10.5)

## 2012-12-02 MED ORDER — ONDANSETRON HCL 4 MG/2ML IJ SOLN
4.0000 mg | Freq: Once | INTRAMUSCULAR | Status: AC
  Administered 2012-12-02: 4 mg via INTRAVENOUS
  Filled 2012-12-02: qty 2

## 2012-12-02 MED ORDER — MECLIZINE HCL 25 MG PO TABS
25.0000 mg | ORAL_TABLET | Freq: Once | ORAL | Status: AC
  Administered 2012-12-02: 25 mg via ORAL
  Filled 2012-12-02: qty 1

## 2012-12-02 MED ORDER — DIPHENHYDRAMINE HCL 50 MG/ML IJ SOLN
25.0000 mg | Freq: Once | INTRAMUSCULAR | Status: AC
  Administered 2012-12-02: 25 mg via INTRAVENOUS
  Filled 2012-12-02: qty 1

## 2012-12-02 MED ORDER — MECLIZINE HCL 50 MG PO TABS: 50.0000 mg | ORAL_TABLET | Freq: Three times a day (TID) | ORAL | Status: DC | PRN

## 2012-12-02 MED ORDER — ONDANSETRON HCL 4 MG PO TABS: 4.0000 mg | ORAL_TABLET | Freq: Four times a day (QID) | ORAL | Status: DC

## 2012-12-02 NOTE — ED Provider Notes (Addendum)
CSN: [id number]     Arrival date & time 12/02/12  1003 History   First MD Initiated Contact with Patient 12/02/12 1035     Chief Complaint  Patient presents with  . Dizziness  . Nausea    HPI  Patient awakened this morning at 0800. She felt like things were spinning around her. This feeling of vertigo this persisted through the morning. She became somewhat nauseated presents here. She has similar episode a few years ago. Was seen by her physician. No treatment rendered. Her symptoms resolved within a day or so.  No vomiting or nausea. No falls or injuries. No disuse of extremities weakness or any paresthesias. No vision changes. No difficult with speech or swallowing.  Past Medical History  Diagnosis Date  . Hypertension   . Diabetes mellitus    History reviewed. No pertinent past surgical history. History reviewed. No pertinent family history. History  Substance Use Topics  . Smoking status: Never Smoker   . Smokeless tobacco: Not on file  . Alcohol Use: No   OB History   Grav Para Term Preterm Abortions TAB SAB Ect Mult Living                 Review of Systems  Constitutional: Negative for fever, chills, diaphoresis, appetite change and fatigue.  HENT: Negative for mouth sores, sore throat and trouble swallowing.   Eyes: Negative for visual disturbance.  Respiratory: Negative for cough, chest tightness, shortness of breath and wheezing.   Cardiovascular: Negative for chest pain.  Gastrointestinal: Positive for nausea. Negative for vomiting, abdominal pain, diarrhea and abdominal distention.  Endocrine: Negative for polydipsia, polyphagia and polyuria.  Genitourinary: Negative for dysuria, frequency and hematuria.  Musculoskeletal: Negative for gait problem.  Skin: Negative for color change, pallor and rash.  Neurological: Positive for dizziness. Negative for syncope, light-headedness and headaches.  Hematological: Does not bruise/bleed easily.  Psychiatric/Behavioral:  Negative for behavioral problems and confusion.    Allergies  Review of patient's allergies indicates no known allergies.  Home Medications   Current Outpatient Rx  Name  Route  Sig  Dispense  Refill  . amLODipine (NORVASC) 10 MG tablet   Oral   Take 10 mg by mouth daily.         Marland Kitchen aspirin 81 MG tablet   Oral   Take 81 mg by mouth daily.         . metFORMIN (GLUCOPHAGE) 500 MG tablet   Oral   Take 500 mg by mouth 2 (two) times daily with a meal.         . thioridazine (MELLARIL) 25 MG tablet   Oral   Take 75 mg by mouth at bedtime.          . meclizine (ANTIVERT) 50 MG tablet   Oral   Take 1 tablet (50 mg total) by mouth 3 (three) times daily as needed.   30 tablet   0   . ondansetron (ZOFRAN) 4 MG tablet   Oral   Take 1 tablet (4 mg total) by mouth every 6 (six) hours.   12 tablet   0    BP 133/61  Pulse 69  Temp(Src) 97.6 F (36.4 C) (Oral)  Resp 16  SpO2 94% Physical Exam  Constitutional: She is oriented to person, place, and time. She appears well-developed and well-nourished. No distress.  HENT:  Head: Normocephalic.  Eyes: Conjunctivae are normal. Pupils are equal, round, and reactive to light. No scleral icterus.  Neck: Normal  range of motion. Neck supple. No thyromegaly present.  Cardiovascular: Normal rate and regular rhythm.  Exam reveals no gallop and no friction rub.   No murmur heard. Pulmonary/Chest: Effort normal and breath sounds normal. No respiratory distress. She has no wheezes. She has no rales.  Abdominal: Soft. Bowel sounds are normal. She exhibits no distension. There is no tenderness. There is no rebound.  Musculoskeletal: Normal range of motion.  Neurological: She is alert and oriented to person, place, and time.  She is symptomatic sitting up or turning her head. She does not have inducible nystagmus. Remainder of her cranial nerves are intact and symmetric. Normal peripheral neurological exam without pronator drift.  Skin:  Skin is warm and dry. No rash noted.  Psychiatric: She has a normal mood and affect. Her behavior is normal.    ED Course  Procedures (including critical care time) Labs Review Labs Reviewed  GLUCOSE, CAPILLARY - Abnormal; Notable for the following:    Glucose-Capillary 240 (*)    All other components within normal limits  BASIC METABOLIC PANEL - Abnormal; Notable for the following:    Glucose, Bld 251 (*)    GFR calc non Af Amer 64 (*)    GFR calc Af Amer 75 (*)    All other components within normal limits  CBC WITH DIFFERENTIAL   Imaging Review Ct Head Wo Contrast  12/02/2012   CLINICAL DATA:  Dizziness and nausea.  EXAM: CT HEAD WITHOUT CONTRAST  TECHNIQUE: Contiguous axial images were obtained from the base of the skull through the vertex without intravenous contrast.  COMPARISON:  Brain MRI 09/02/2009.  FINDINGS: There is mild cortical atrophy and chronic microvascular ischemic change. No evidence of acute intracranial abnormality including infarct, hemorrhage, mass lesion, mass effect, midline shift or abnormal extra-axial fluid collection is identified. The calvarium is intact. Imaged paranasal sinuses and mastoid air cells are clear.  IMPRESSION: No acute finding.   Electronically Signed   By: Drusilla Kanner M.D.   On: 12/02/2012 12:42    EKG Interpretation     Ventricular Rate:  79 PR Interval:  137 QRS Duration: 64 QT Interval:  378 QTC Calculation: 434 R Axis:   27 Text Interpretation:  Normal sinus rhythm No ST or T wave changes. No Ectopy.            MDM   1. Vertigo    Evaluation is negative. No sign of acute CVA. No Abnormalities. Not anemic. Her symptoms were much improved after medications. Plan is discharge home. Vertigo precautions. Zofran when necessary. Antivert until 24 hours without symptoms.    Roney Marion, MD 12/02/12 1408  Roney Marion, MD 12/03/12 1610  Roney Marion, MD 12/03/12 [phone]

## 2012-12-02 NOTE — ED Notes (Signed)
Pt states that she woke up this am around 0700 with sudden onset of dizziness and nausea. Pt denies vomiting. Pt able to ambulate.

## 2012-12-02 NOTE — Progress Notes (Signed)
   CARE MANAGEMENT ED NOTE 12/02/2012  Patient:  Michelle Macdonald, Michelle Macdonald   Account Number:  [id number]  Date Initiated:  12/02/2012  Documentation initiated by:  Edd Arbour  Subjective/Objective Assessment:   76 yr old medicare pt without pcp listed in EPIC pt states pcp is edwin avbuere     Subjective/Objective Assessment Detail:     Action/Plan:   Spoke with pt updated EPIC   Action/Plan Detail:   Anticipated DC Date:  12/02/2012     Status Recommendation to Physician:   Result of Recommendation:    Other ED Services  Consult Working Plan    DC Planning Services  Other  Outpatient Services - Pt will follow up  PCP issues    Choice offered to / List presented to:            Status of service:  Completed, signed off  ED Comments:   ED Comments Detail:

## 2012-12-02 NOTE — ED Notes (Signed)
Pt transported to CT ?

## 2012-12-02 NOTE — ED Notes (Signed)
Pt returned from CT °

## 2012-12-02 NOTE — ED Notes (Addendum)
Pt states she began to have dizziness and nausea starting at 0700 this am. Denies pain, ambulatory to room. Pt ate breakfast this am.

## 2013-05-24 ENCOUNTER — Encounter (HOSPITAL_COMMUNITY): Payer: Self-pay | Admitting: Emergency Medicine

## 2013-05-24 ENCOUNTER — Emergency Department (INDEPENDENT_AMBULATORY_CARE_PROVIDER_SITE_OTHER)
Admission: EM | Admit: 2013-05-24 | Discharge: 2013-05-24 | Disposition: A | Discharge status: Home / Self Care | Payer: Medicare Other | Source: Home / Self Care | Attending: Family Medicine | Admitting: Family Medicine

## 2013-05-24 DIAGNOSIS — N950 Postmenopausal bleeding: Secondary | ICD-10-CM | POA: Diagnosis not present

## 2013-05-24 DIAGNOSIS — H6120 Impacted cerumen, unspecified ear: Secondary | ICD-10-CM | POA: Diagnosis not present

## 2013-05-24 NOTE — Discharge Instructions (Signed)
Postmenopausal Bleeding Postmenopausal bleeding is any bleeding a woman has after she has entered into menopause. Menopause is the end of a woman's fertile years. After menopause, a woman no longer ovulates or has menstrual periods.  Postmenopausal bleeding can be caused by various things. Any type of postmenopausal bleeding, even if it appears to be a typical menstrual period, is concerning. This should be evaluated by your health care provider. Any treatment will depend on the cause of the bleeding. HOME CARE INSTRUCTIONS Monitor your condition for any changes. The following actions may help to alleviate any discomfort you are experiencing:  Avoid the use of tampons and douches as directed by your health care provider.  Change your pads frequently.  Get regular pelvic exams and Pap tests.  Keep all follow-up appointments for diagnostic tests as directed by your health care provider. SEEK MEDICAL CARE IF:   Your bleeding lasts more than 1 week.  You have abdominal pain.  You have bleeding with sexual intercourse. SEEK IMMEDIATE MEDICAL CARE IF:   You have a fever, chills, headache, dizziness, muscle aches, and bleeding.  You have severe pain with bleeding.  You are passing blood clots.  You have bleeding and need more than 1 pad an hour.  You feel faint. MAKE SURE YOU:  Understand these instructions.  Will watch your condition.  Will get help right away if you are not doing well or get worse. Document Released: 05/07/2005 Document Revised: 11/17/2012 Document Reviewed: 08/26/2012 [facility] Patient Information 2014 [place], [place].    Cerumen Plug A cerumen plug is having too much wax in your ear canal. The outer ear canal is lined with hairs and glands that secrete wax. This wax is called cerumen. This protects the ear canal. It also helps prevent material from entering the ear. Too much wax can cause a feeling of fullness in the ears, decreased hearing, ringing in the  ears, or an earache. Sometimes your caregiver will remove a cerumen plug with an instrument called a curette. Or he/she may flush the ear canal with warm water from a syringe to remove the wax. You may simply be sent home to follow the home care instructions below for wax removal. Generally ear wax does not have to be removed unless it is causing a problem such as one of those listed above. When too much wax is causing a problem, the following are a few home remedies which can be used to help this problem. HOME CARE INSTRUCTIONS   Put a couple drops of glycerin, baby oil, or mineral oil in the ear a couple times of day. Do this every day for several days. After putting the drops in, you will need to lay with the affected ear pointing up for a couple minutes. This allows the drops to remain in the canal and run down to the area of wax blockage. This will soften the wax plug. It may also make your hearing worse as the wax softens and blocks the canal even more.  After a couple days, you may gently flush the ear canal with warm water from a syringe. Do this by pulling your ear up and back with your head tilted slightly forward and towards a pan to catch the water. This is most easily done with a helper. You can also accomplish the same thing by letting the shower beat into your ear canal to wash the wax out. Sometimes this will not be immediately successful. You will have to return to the first step  of using the oil to further soften the wax. Then resume washing the ear canal out with a syringe or shower.  Following removal of the wax, put ten to twenty drops of rubbing alcohol into the outer ears. This will dry the canal and prevent an infection.  Do not irrigate or wash out your ears if you have had a perforated ear drum or mastoid surgery. SEEK IMMEDIATE MEDICAL CARE IF:   You are unsuccessful with the above instructions for home care.  You develop ear pain or drainage from the ear. MAKE SURE YOU:     Understand these instructions.  Will watch your condition.  Will get help right away if you are not doing well or get worse. Document Released: 10/22/2000 Document Revised: 04/21/2011 Document Reviewed: 01/19/2008 [facility] Patient Information 2014 [place].

## 2013-05-24 NOTE — ED Notes (Signed)
C/o vaginal bleeding x 1 wk.  States heavy in the begging but has lightened up some.  Denies any pelvic or abdominal pain.     Also c/o pressure in both ears.   No otc treatments tried.

## 2013-05-24 NOTE — ED Provider Notes (Signed)
CSN: [id number]     Arrival date & time 05/24/13  1045 History   None    Chief Complaint  Patient presents with  . Vaginal Bleeding  . Ear Fullness   (Consider location/radiation/quality/duration/timing/severity/associated sxs/prior Treatment) HPI Comments: 78 year old female presents complaining of one week of vaginal bleeding. This is initially very heavy but it has slowed down now to the point where she just has spotting. It was still quite heavy as of yesterday. She also admits to some fullness in her ears. She denies any other symptoms including weight loss, abdominal pain, bloating, or history of postmenopausal vaginal bleeding. She went through menopause at approximately age 89 and has not had any bleeding since that time. She is not sexually active. No dizziness or lightheadedness.  [name] presenting with vaginal bleeding and plugged ear sensation.  Vaginal Bleeding Associated symptoms: no abdominal pain, no fatigue, no fever and no nausea   Ear Fullness Pertinent negatives include no abdominal pain and no shortness of breath.    Past Medical History  Diagnosis Date  . Hypertension   . Diabetes mellitus    History reviewed. No pertinent past surgical history. History reviewed. No pertinent family history. History  Substance Use Topics  . Smoking status: Never Smoker   . Smokeless tobacco: Not on file  . Alcohol Use: No   OB History   Grav Para Term Preterm Abortions TAB SAB Ect Mult Living                 Review of Systems  Constitutional: Negative for fever, chills and fatigue.  HENT: Positive for ear pain (fullness).   Eyes: Negative for visual disturbance.  Respiratory: Negative for cough and shortness of breath.   Gastrointestinal: Negative for nausea, vomiting, abdominal pain and diarrhea.  Endocrine: Negative for polydipsia and polyuria.  Genitourinary: Positive for vaginal bleeding.  Musculoskeletal: Negative for arthralgias and  myalgias.  Skin: Negative for rash.  Neurological: Negative for weakness.  All other systems reviewed and are negative.   Allergies  Review of patient's allergies indicates no known allergies.  Home Medications   Prior to Admission medications   Medication Sig Start Date End Date Taking? Authorizing Provider  amLODipine (NORVASC) 10 MG tablet Take 10 mg by mouth daily.    Historical Provider, MD  aspirin 81 MG tablet Take 81 mg by mouth daily.    Historical Provider, MD  meclizine (ANTIVERT) 50 MG tablet Take 1 tablet (50 mg total) by mouth 3 (three) times daily as needed. 12/02/12   Tanna Furry, MD  metFORMIN (GLUCOPHAGE) 500 MG tablet Take 500 mg by mouth 2 (two) times daily with a meal.    Historical Provider, MD  ondansetron (ZOFRAN) 4 MG tablet Take 1 tablet (4 mg total) by mouth every 6 (six) hours. 12/02/12   Tanna Furry, MD  thioridazine (MELLARIL) 25 MG tablet Take 75 mg by mouth at bedtime.     Historical Provider, MD   BP 145/93  Pulse 80  Temp(Src) 98.7 F (37.1 C) (Oral)  Resp 18  SpO2 98% Physical Exam  Nursing note and vitals reviewed. Constitutional: She is oriented to person, place, and time. Vital signs are normal. She appears well-developed and well-nourished. No distress.  HENT:  Head: Normocephalic and atraumatic.  Cerumen impaction bilaterally  Pulmonary/Chest: Effort normal. No respiratory distress.  Abdominal: Normal appearance and bowel sounds are normal. She exhibits no ascites and no mass. There is no hepatosplenomegaly. There is no tenderness.  There is no rigidity, no rebound, no guarding, no CVA tenderness, no tenderness at McBurney's point and negative Murphy's sign. No hernia.  Genitourinary:  There is scant bleeding in the vaginal vault. Difficult to visualize the source of bleeding due to visual obstruction from cystocele  Neurological: She is alert and oriented to person, place, and time. She has normal strength. Coordination normal.  Skin: Skin is  warm and dry. No rash noted. She is not diaphoretic.  Psychiatric: She has a normal mood and affect. Judgment normal.    ED Course  Procedures (including critical care time) Labs Review Labs Reviewed - No data to display   Imaging Review No results found.   MDM   1. Postmenopausal vaginal bleeding   2. Cerumen impaction    Needs to be seen by GYN to rule out uterine malignancy. I spoke to [name], GYN on call, he will see this pt in the office this week.   Ears rinsed out.       Liam Graham, PA-C 05/24/13 1401

## 2013-05-24 NOTE — ED Notes (Signed)
Patient could not void at this time

## 2013-05-24 NOTE — ED Notes (Signed)
Patient still unable to void at this time.  

## 2013-05-27 NOTE — ED Provider Notes (Signed)
Medical screening examination/treatment/procedure(s) were performed by resident physician or non-physician practitioner and as supervising physician I was immediately available for consultation/collaboration.   Pauline Good MD.   Billy Fischer, MD 05/27/13 1215

## 2013-06-09 ENCOUNTER — Other Ambulatory Visit (HOSPITAL_COMMUNITY)
Admission: RE | Admit: 2013-06-09 | Discharge: 2013-06-09 | Disposition: A | Discharge status: Home / Self Care | Payer: Medicare Other | Source: Ambulatory Visit | Attending: Obstetrics & Gynecology | Admitting: Obstetrics & Gynecology

## 2013-06-09 ENCOUNTER — Ambulatory Visit (INDEPENDENT_AMBULATORY_CARE_PROVIDER_SITE_OTHER): Payer: Medicare Other | Admitting: Obstetrics & Gynecology

## 2013-06-09 ENCOUNTER — Encounter: Payer: Self-pay | Admitting: Obstetrics & Gynecology

## 2013-06-09 VITALS — BP 132/84 | HR 92 | Temp 98.1°F | Ht 61.0 in | Wt 170.4 lb

## 2013-06-09 DIAGNOSIS — N950 Postmenopausal bleeding: Secondary | ICD-10-CM | POA: Insufficient documentation

## 2013-06-09 DIAGNOSIS — C549 Malignant neoplasm of corpus uteri, unspecified: Secondary | ICD-10-CM | POA: Diagnosis present

## 2013-06-09 DIAGNOSIS — R300 Dysuria: Secondary | ICD-10-CM

## 2013-06-09 LAB — POCT URINALYSIS DIP (DEVICE)
Bilirubin Urine: NEGATIVE
Glucose, UA: NEGATIVE mg/dL
Ketones, ur: NEGATIVE mg/dL
Nitrite: NEGATIVE
PROTEIN: NEGATIVE mg/dL
SPECIFIC GRAVITY, URINE: 1.01 (ref 1.005–1.030)
UROBILINOGEN UA: 0.2 mg/dL (ref 0.0–1.0)
pH: 5.5 (ref 5.0–8.0)

## 2013-06-09 NOTE — Progress Notes (Signed)
Pt. Reports having heavy bleeding starting 3 weeks ago 05/23/13. Bleeding lasted for 2 weeks, heavy at times and then lead to spotting. Stopped 06/06/13.

## 2013-06-09 NOTE — Progress Notes (Signed)
Patient ID: Michelle Macdonald, female   DOB: 1935-12-21, 78 y.o.   MRN: [id number]  Chief Complaint  Patient presents with  . post menopausal bleeding.    bleeding 3 weeks ago and lasted for 2 weeks; heavy bleeding lead to spotting     HPI Michelle Macdonald is a 78 y.o. female.  [id number] No LMP recorded. Patient is postmenopausal. 2 week episode of vaginal bleeding 1 mo ago, no pain, no d/c, no bleeding now  HPI  Past Medical History  Diagnosis Date  . Hypertension   . Diabetes mellitus     History reviewed. No pertinent past surgical history.  Family History  Problem Relation Age of Onset  . Heart disease Mother   . Heart disease Sister     Social History History  Substance Use Topics  . Smoking status: Never Smoker   . Smokeless tobacco: Never Used  . Alcohol Use: No    No Known Allergies  Current Outpatient Prescriptions  Medication Sig Dispense Refill  . amLODipine (NORVASC) 10 MG tablet Take 10 mg by mouth daily.      Marland Kitchen aspirin 81 MG tablet Take 81 mg by mouth daily.      Marland Kitchen atorvastatin (LIPITOR) 20 MG tablet Take 20 mg by mouth daily.      . metFORMIN (GLUCOPHAGE) 500 MG tablet Take 500 mg by mouth 2 (two) times daily with a meal.      . thioridazine (MELLARIL) 25 MG tablet Take 75 mg by mouth at bedtime.       Marland Kitchen thioridazine (MELLARIL) 25 MG tablet Take 25 mg by mouth 3 (three) times daily.      . meclizine (ANTIVERT) 50 MG tablet Take 1 tablet (50 mg total) by mouth 3 (three) times daily as needed.  30 tablet  0  . ondansetron (ZOFRAN) 4 MG tablet Take 1 tablet (4 mg total) by mouth every 6 (six) hours.  12 tablet  0   No current facility-administered medications for this visit.    Review of Systems Review of Systems  Constitutional: Negative.   Respiratory: Negative.   Cardiovascular: Negative.   Genitourinary: Positive for dysuria. Negative for vaginal bleeding and pelvic pain.    Blood pressure 132/84, pulse 92, temperature 98.1 F (36.7 C),  height 5\' 1"  (1.549 m), weight 170 lb 6.4 oz (77.293 kg).  Physical Exam Physical Exam  Constitutional: She appears well-developed. No distress.  Abdominal: Soft. There is no tenderness.  Genitourinary: Vagina normal and uterus normal. No vaginal discharge found.  Cervix normal no blood   Patient given informed consent, signed copy in the chart, time out was performed. Appropriate time out taken. . The patient was placed in the lithotomy position and the cervix brought into view with sterile speculum.  Portio of cervix cleansed x 2 with betadine swabs.  A tenaculum was placed in the anterior lip of the cervix.  The uterus was sounded for depth of 10 cm. A pipelle was introduced to into the uterus, suction created,  and an endometrial sample was obtained. All equipment was removed and accounted for.  The patient tolerated the procedure well.    Patient given post procedure instructions. The patient will return in 2 weeks for results. Data Reviewed Office note UC  Assessment    Postmenopausal bleeding   possible UTI  Plan    Endomterial biopsy result in 2 weeks, urine culture        Woodroe Mode 06/09/2013, 3:40 PM

## 2013-06-09 NOTE — Patient Instructions (Signed)
Postmenopausal Bleeding Postmenopausal bleeding is any bleeding a woman has after she has entered into menopause. Menopause is the end of a woman's fertile years. After menopause, a woman no longer ovulates or has menstrual periods.  Postmenopausal bleeding can be caused by various things. Any type of postmenopausal bleeding, even if it appears to be a typical menstrual period, is concerning. This should be evaluated by your health care provider. Any treatment will depend on the cause of the bleeding. HOME CARE INSTRUCTIONS Monitor your condition for any changes. The following actions may help to alleviate any discomfort you are experiencing:  Avoid the use of tampons and douches as directed by your health care provider.  Change your pads frequently.  Get regular pelvic exams and Pap tests.  Keep all follow-up appointments for diagnostic tests as directed by your health care provider. SEEK MEDICAL CARE IF:   Your bleeding lasts more than 1 week.  You have abdominal pain.  You have bleeding with sexual intercourse. SEEK IMMEDIATE MEDICAL CARE IF:   You have a fever, chills, headache, dizziness, muscle aches, and bleeding.  You have severe pain with bleeding.  You are passing blood clots.  You have bleeding and need more than 1 pad an hour.  You feel faint. MAKE SURE YOU:  Understand these instructions.  Will watch your condition.  Will get help right away if you are not doing well or get worse. Document Released: 05/07/2005 Document Revised: 11/17/2012 Document Reviewed: 08/26/2012 [employer] Patient Information 2014 [employer].  

## 2013-06-11 LAB — URINE CULTURE
COLONY COUNT: NO GROWTH
Organism ID, Bacteria: NO GROWTH

## 2013-06-13 ENCOUNTER — Encounter: Payer: Medicare Other | Admitting: Nurse Practitioner

## 2013-06-23 ENCOUNTER — Ambulatory Visit (INDEPENDENT_AMBULATORY_CARE_PROVIDER_SITE_OTHER): Payer: Medicare Other | Admitting: Obstetrics & Gynecology

## 2013-06-23 DIAGNOSIS — C549 Malignant neoplasm of corpus uteri, unspecified: Secondary | ICD-10-CM

## 2013-06-23 DIAGNOSIS — C541 Malignant neoplasm of endometrium: Secondary | ICD-10-CM | POA: Insufficient documentation

## 2013-06-23 NOTE — Patient Instructions (Signed)
Uterine Cancer Uterine cancer is an abnormal growth of tissue (tumor) in the uterus that is cancerous (malignant). Unlike noncancerous (benign) tumors, malignant tumors can spread to other parts of your body. The wall of the uterus has two layers of tissue. The inner layer is the endometrium. The outer layer of muscle tissue is the myometrium. The most common type of uterine cancer begins in the endometrium. This is called endometrial cancer. Cancer that begins in the myometrium is called uterine sarcoma, which is very rare.  RISK FACTORS  Although the exact cause of uterine cancer is unknown, there are a number of risk factors that can increase your chances of getting uterine cancer. They include:  Your age. Uterine cancer occurs mostly in women older than 50 years.   Having an enlarged endometrium (endometrial hyperplasia).   Using hormone therapy.   Obesity.   Taking the drug tamoxifen.   White race.   Infertility.   Never being pregnant.   Beginning menstrual periods at an age younger than 12 years.   Having menstrual periods at an age older than 99 years.   Personal history of ovarian, intestinal, or colorectal cancer.   Having a family history of uterine cancer.   Having a family history of hereditary nonpolyposis colon cancer (HNPCC).   Having diabetes, high blood pressure, thyroid disease, or gallbladder disease.   Long-term use of high-dose birth control pills.   Exposure to radiation.   Smoking.  SIGNS AND SYMPTOMS   Abnormal vaginal bleeding or discharge. Bleeding may start as a watery, blood-streaked flow that gradually contains more blood.   Any vaginal bleeding after menopause.   Difficult or painful urination.   Pain during intercourse.   Pain in the pelvic area.  Mass in the vagina.  Pain or fullness in the abdomen.  Frequent urination.  Bleeding between periods.  Growth of the stomach.   Unexplained weight loss.   Uterine cancer usually occurs after menopause. However, it may also occur around the time that menopause begins. Abnormal vaginal bleeding is the most common symptom of uterine cancer. Women should not assume that abnormal vaginal bleeding is part of menopause. DIAGNOSIS  Your health care provider will ask about your medical history. He or she may also perform a number of procedures, such as:  A physical and pelvic exam. Your health care provider will feel your pelvis for any lumps.   Blood and urine tests.   X-rays.   Imaging tests, such as CT scans, ultrasonography, or MRIs.   A hysteroscopy to view the inside of your uterus.   A Pap test to sample cells from the cervix and upper vagina to check for abnormal cells.   Taking a tissue sample (biopsy) from the uterine lining to look for cancer cells.   A dilation and curettage (D&C). This involves stretching (dilation) the cervix and scraping (curettage) the inside lining of the uterus to get a tissue sample. The sample is examined under a microscope to look for cancer cells.  Your cancer will be staged to determine its severity and extent. Staging is a careful attempt to find out the size of the tumor, whether the cancer has spread, and if so, to what parts of the body. You may need to have more tests to determine the stage of your cancer. The test results will help determine what treatment plan is best for you. Cancer stages include:   Stage I The cancer is only found in the uterus.  Stage II The  cancer has spread to the cervix.  Stage III The cancer has spread outside the uterus, but not outside the pelvis. The cancer may have spread to the lymph nodes in the pelvis.  Stage IV The cancer has spread to other parts of the body, such as the bladder or rectum. TREATMENT  Most women with uterine cancer are treated with surgery. This includes removing the uterus, cervix, fallopian tubes, and ovaries (total hysterectomy). Your  lymph nodes near the tumor may also be removed. Some women have radiation, chemotherapy, or hormonal therapy. Other women have a combination of these therapies. HOME CARE INSTRUCTIONS   Only take over-the-counter or prescription medicines as directed by your health care provider.   Maintain a healthy diet.  Exercise regularly.   If you have diabetes, high blood pressure, thyroid disease, or gallbladder disease, follow your health care provider's instructions to keep it under control.   Do not smoke.   Consider joining a support group. This may help you learn to cope with the stress of having uterine cancer.   Seek advice to help you manage treatment side effects.   Keep all follow-up appointments as directed by your health care provider.  SEEK MEDICAL CARE IF:  You have increased stomach or pelvic pain.  You cannot urinate.  You have abnormal bleeding. Document Released: 01/27/2005 Document Revised: 09/29/2012 Document Reviewed: 07/16/2012 [employer] Patient Information 2014 [employer].  

## 2013-06-23 NOTE — Progress Notes (Signed)
Patient ID: Michelle Macdonald, female   DOB: 10-15-1935, 78 y.o.   MRN: [id number] No complaints, no bleeding after endometrial biopsy. Result c/w grade III endometrial carcinoma. I explained the dx and likely therapy with TAH/BSO, LN samling, possible radiation therapy. Appointment with Dr. Aldean Ast 06/29/13 at [facility].   Woodroe Mode, MD 06/23/2013

## 2013-06-29 ENCOUNTER — Telehealth: Payer: Self-pay | Admitting: *Deleted

## 2013-06-29 ENCOUNTER — Encounter: Payer: Self-pay | Admitting: Gynecology

## 2013-06-29 ENCOUNTER — Ambulatory Visit: Payer: Medicare Other | Attending: Gynecology | Admitting: Gynecology

## 2013-06-29 VITALS — BP 166/80 | HR 90 | Temp 98.3°F | Resp 18 | Ht 61.0 in | Wt 169.4 lb

## 2013-06-29 DIAGNOSIS — C541 Malignant neoplasm of endometrium: Secondary | ICD-10-CM

## 2013-06-29 DIAGNOSIS — Z79899 Other long term (current) drug therapy: Secondary | ICD-10-CM | POA: Insufficient documentation

## 2013-06-29 DIAGNOSIS — E119 Type 2 diabetes mellitus without complications: Secondary | ICD-10-CM | POA: Insufficient documentation

## 2013-06-29 DIAGNOSIS — I10 Essential (primary) hypertension: Secondary | ICD-10-CM | POA: Insufficient documentation

## 2013-06-29 DIAGNOSIS — C549 Malignant neoplasm of corpus uteri, unspecified: Secondary | ICD-10-CM | POA: Insufficient documentation

## 2013-06-29 DIAGNOSIS — Z7982 Long term (current) use of aspirin: Secondary | ICD-10-CM | POA: Insufficient documentation

## 2013-06-29 NOTE — Patient Instructions (Addendum)
You will have surgery on June 2 with Dr. [name] at [facility].  Pre-op testing will call you with an appt. Prior to surgery.   Preparing for your Surgery  Pre-operative Testing -You will receive a phone call from presurgical testing at [facility] to arrange for a pre-operative testing appointment before your surgery.  This appointment normally occurs one to two weeks before your scheduled surgery.   -Bring your insurance card, copy of an advanced directive if applicable, medication list  -At that visit, you will be asked to sign a consent for a possible blood transfusion in case a transfusion becomes necessary during surgery.  The need for a blood transfusion is rare but having consent is a necessary part of your care.     Day Before Surgery at [facility] will be asked to take in only clear liquids the day before surgery.  Examples of clear liquids include broths, jello, and clear juices.   You will be advised to have nothing to eat or drink after midnight the evening before.    Your role in recovery Your role is to become active as soon as directed by your doctor, while still giving yourself time to heal.  Rest when you feel tired. You will be asked to do the following in order to speed your recovery:  - Cough and breathe deeply. This helps toclear and expand your lungs and can prevent pneumonia. You may be given a spirometer to practice deep breathing. A staff member will show you how to use the spirometer. - Do mild physical activity. Walking or moving your legs help your circulation and body functions return to normal. A staff member will help you when you try to walk and will provide you with simple exercises. Do not try to get up or walk alone the first time. - Actively manage your pain. Managing your pain lets you move in comfort. We will ask you to rate your pain on a scale of zero to 10. It is your responsibility to tell your doctor or nurse where and how much you hurt  so your pain can be treated.  Special Considerations -If you are diabetic, you may be placed on insulin after surgery to have closer control over your blood sugars to promote healing and recovery.  This does not mean that you will be discharged on insulin.  If applicable, your oral antidiabetics will be resumed when you are tolerating a solid diet.  -Your final pathology results from surgery should be available by the Friday after surgery and the results will be relayed to you when available.

## 2013-06-29 NOTE — Addendum Note (Signed)
Addended by: Lucile Crater on: 06/29/2013 02:16 PM   Modules accepted: Orders

## 2013-06-29 NOTE — Progress Notes (Signed)
Consult Note: Gyn-Onc   Michelle Macdonald 78 y.o. female  Chief Complaint  Patient presents with  . endo ca    Assessment : 3 endometrial carcinoma  Plan: I recommend the patient undergo a robotic assisted hysterectomy, bilateral salpingo-oophorectomy, and pelvic and para-aortic lymphadenectomy. We will schedule surgery for June 2. Patient understands and Dr. Wendy Brewster will be the surgeon. The risks of surgery were reviewed with the patient and her daughter and sister and questions are answered.   HPI: 78-year-old (female seen in consultation request of Dr. James Arnold regarding management of a newly diagnosed endometrial adenocarcinoma.  Patient went through menopause in her early 50s and had no bleeding until approximately a month ago when she had bleeding for 2 weeks. The bleeding has stopped. Patient saw Dr. Arnold on 06/09/2013. An endometrial biopsy was obtained revealing a grade 3 endometrial adenocarcinoma.  Patient denies any other pelvic symptoms including pain or pressure. She has no past gynecologic history. She has one cousin who had endometrial carcinoma. She denies any family history of colon cancer..  Review of Systems:10 point review of systems is negative except as noted in interval history.   Vitals: Blood pressure 166/80, pulse 90, temperature 98.3 F (36.8 C), temperature source Oral, resp. rate 18, height 5' 1" (1.549 m), weight 169 lb 6.4 oz (76.839 kg).  Physical Exam: General : The patient is a healthy woman in no acute distress.  HEENT: normocephalic, extraoccular movements normal; neck is supple without thyromegally  Lynphnodes: Supraclavicular and inguinal nodes not enlarged  Abdomen: Soft, non-tender, no ascites, no organomegally, no masses, no hernias  Pelvic:  EGBUS: Normal female  Vagina: Normal, no lesions  Urethra and Bladder: Normal, non-tender  Cervix: Normal there is no bleeding at the present time. Uterus: Difficult to delineate  secondary to the patient's habitus but I believe essentially normal size.  Bi-manual examination: Non-tender; no adenxal masses or nodularity  Rectal: normal sphincter tone, no masses, no blood  Lower extremities: No edema or varicosities. Normal range of motion      No Known Allergies  Past Medical History  Diagnosis Date  . Hypertension   . Diabetes mellitus     History reviewed. No pertinent past surgical history.  Current Outpatient Prescriptions  Medication Sig Dispense Refill  . amLODipine (NORVASC) 10 MG tablet Take 10 mg by mouth daily.      . aspirin 81 MG tablet Take 81 mg by mouth daily.      . atorvastatin (LIPITOR) 20 MG tablet Take 20 mg by mouth daily.      . meclizine (ANTIVERT) 50 MG tablet Take 1 tablet (50 mg total) by mouth 3 (three) times daily as needed.  30 tablet  0  . metFORMIN (GLUCOPHAGE) 500 MG tablet Take 500 mg by mouth 2 (two) times daily with a meal.      . ondansetron (ZOFRAN) 4 MG tablet Take 1 tablet (4 mg total) by mouth every 6 (six) hours.  12 tablet  0  . thioridazine (MELLARIL) 25 MG tablet Take 75 mg by mouth at bedtime.       . thioridazine (MELLARIL) 25 MG tablet Take 25 mg by mouth 3 (three) times daily.       No current facility-administered medications for this visit.    History   Social History  . Marital Status: Widowed    Spouse Name: N/A    Number of Children: N/A  . Years of Education: N/A   Occupational History  .   Not on file.   Social History Main Topics  . Smoking status: Never Smoker   . Smokeless tobacco: Never Used  . Alcohol Use: No  . Drug Use: No  . Sexual Activity: No   Other Topics Concern  . Not on file   Social History Narrative  . No narrative on file    Family History  Problem Relation Age of Onset  . Heart disease Mother   . Heart disease Sister       Evea Sheek L Clarke-Pearson, MD 06/29/2013, 1:51 PM        

## 2013-06-29 NOTE — Telephone Encounter (Signed)
Called pt's PCP, requested Md's last office note be faxed over pt will have surgery on 6/2

## 2013-06-30 ENCOUNTER — Encounter (HOSPITAL_COMMUNITY): Payer: Self-pay | Admitting: Pharmacy Technician

## 2013-07-05 NOTE — Patient Instructions (Addendum)
Michelle Macdonald  07/05/2013   Your procedure is scheduled on:  07/12/2013  0730am-1016am  Report to [facility].  Follow the Signs to [place] at      0530      am  Call this number if you have problems the morning of surgery: [phone]   Remember:   CLEAR LIQUID DIET 24 hours prior to SURGERY.    Take these medicines the morning of surgery with A SIP OF WATER:    Do not wear jewelry, make-up or nail polish.  Do not wear lotions, powders, or perfumes.   Do not shave 48 hours prior to surgery.   Do not bring valuables to the hospital.  Contacts, dentures or bridgework may not be worn into surgery.  Leave suitcase in the car. After surgery it may be brought to your room.  For patients admitted to the hospital, checkout time is 11:00 AM the day of  discharge.          Please read over the following fact sheets that you were given:   [place] Allowed                                                                     Foods Excluded  Coffee and tea, regular and decaf                             liquids that you cannot  Plain Jell-O in any flavor                                             see through such as: Fruit ices (not with fruit pulp)                                     milk, soups, orange juice  Iced Popsicles                                    All solid food Carbonated beverages, regular and diet                                    Cranberry, grape and apple juices Sports drinks like Gatorade Lightly seasoned clear broth or consume(fat free) Sugar, honey syrup  Sample Menu Breakfast                                Lunch                                     Supper Cranberry juice                    Beef broth  Chicken broth Jell-O                                     Grape juice                           Apple juice Coffee or tea                        Jell-O                                      Popsicle                                                 Coffee or tea                        Coffee or tea  _____________________________________________________________________  [facility] - Preparing for Surgery Before surgery, you can play an important role.  Because skin is not sterile, your skin needs to be as free of germs as possible.  You can reduce the number of germs on your skin by washing with CHG (chlorahexidine gluconate) soap before surgery.  CHG is an antiseptic cleaner which kills germs and bonds with the skin to continue killing germs even after washing. Please DO NOT use if you have an allergy to CHG or antibacterial soaps.  If your skin becomes reddened/irritated stop using the CHG and inform your nurse when you arrive at Short Stay. Do not shave (including legs and underarms) for at least 48 hours prior to the first CHG shower.  You may shave your face/neck. Please follow these instructions carefully:  1.  Shower with CHG Soap the night before surgery and the  morning of Surgery.  2.  If you choose to wash your hair, wash your hair first as usual with your  normal  shampoo.  3.  After you shampoo, rinse your hair and body thoroughly to remove the  shampoo.                           4.  Use CHG as you would any other liquid soap.  You can apply chg directly  to the skin and wash                       Gently with a scrungie or clean washcloth.  5.  Apply the CHG Soap to your body ONLY FROM THE NECK DOWN.   Do not use on face/ open                           Wound or open sores. Avoid contact with eyes, ears mouth and genitals (private parts).                       Wash face,  Genitals (private parts) with your normal soap.             6.  Wash thoroughly, paying special attention to the area  where your surgery  will be performed.  7.  Thoroughly rinse your body with warm water from the neck down.  8.  DO NOT shower/wash with your normal soap after using and rinsing off  the CHG Soap.                 9.  Pat yourself dry with a clean towel.            10.  Wear clean pajamas.            11.  Place clean sheets on your bed the night of your first shower and do not  sleep with pets. Day of Surgery : Do not apply any lotions/deodorants the morning of surgery.  Please wear clean clothes to the hospital/surgery center.  FAILURE TO FOLLOW THESE INSTRUCTIONS MAY RESULT IN THE CANCELLATION OF YOUR SURGERY PATIENT SIGNATURE_________________________________  NURSE SIGNATURE__________________________________  ________________________________________________________________________  WHAT IS A BLOOD TRANSFUSION? Blood Transfusion Information  A transfusion is the replacement of blood or some of its parts. Blood is made up of multiple cells which provide different functions.  Red blood cells carry oxygen and are used for blood loss replacement.  White blood cells fight against infection.  Platelets control bleeding.  Plasma helps clot blood.  Other blood products are available for specialized needs, such as hemophilia or other clotting disorders. BEFORE THE TRANSFUSION  Who gives blood for transfusions?   Healthy volunteers who are fully evaluated to make sure their blood is safe. This is blood bank blood. Transfusion therapy is the safest it has ever been in the practice of medicine. Before blood is taken from a donor, a complete history is taken to make sure that person has no history of diseases nor engages in risky social behavior (examples are intravenous drug use or sexual activity with multiple partners). The donor's travel history is screened to minimize risk of transmitting infections, such as malaria. The donated blood is tested for signs of infectious diseases, such as HIV and hepatitis. The blood is then tested to be sure it is compatible with you in order to minimize the chance of a transfusion reaction. If you or a relative donates blood, this is often done in anticipation  of surgery and is not appropriate for emergency situations. It takes many days to process the donated blood. RISKS AND COMPLICATIONS Although transfusion therapy is very safe and saves many lives, the main dangers of transfusion include:   Getting an infectious disease.  Developing a transfusion reaction. This is an allergic reaction to something in the blood you were given. Every precaution is taken to prevent this. The decision to have a blood transfusion has been considered carefully by your caregiver before blood is given. Blood is not given unless the benefits outweigh the risks. AFTER THE TRANSFUSION  Right after receiving a blood transfusion, you will usually feel much better and more energetic. This is especially true if your red blood cells have gotten low (anemic). The transfusion raises the level of the red blood cells which carry oxygen, and this usually causes an energy increase.  The nurse administering the transfusion will monitor you carefully for complications. HOME CARE INSTRUCTIONS  No special instructions are needed after a transfusion. You may find your energy is better. Speak with your caregiver about any limitations on activity for underlying diseases you may have. SEEK MEDICAL CARE IF:   Your condition is not improving after your transfusion.  You develop redness or irritation at the intravenous (  IV) site. SEEK IMMEDIATE MEDICAL CARE IF:  Any of the following symptoms occur over the next 12 hours:  Shaking chills.  You have a temperature by mouth above 102 F (38.9 C), not controlled by medicine.  Chest, back, or muscle pain.  People around you feel you are not acting correctly or are confused.  Shortness of breath or difficulty breathing.  Dizziness and fainting.  You get a rash or develop hives.  You have a decrease in urine output.  Your urine turns a dark color or changes to pink, red, or brown. Any of the following symptoms occur over the next 10  days:  You have a temperature by mouth above 102 F (38.9 C), not controlled by medicine.  Shortness of breath.  Weakness after normal activity.  The white part of the eye turns yellow (jaundice).  You have a decrease in the amount of urine or are urinating less often.  Your urine turns a dark color or changes to pink, red, or brown. Document Released: 01/25/2000 Document Revised: 04/21/2011 Document Reviewed: 09/13/2007 ExitCare Patient Information 2014 [place].  _______________________________________________________________________  Incentive Spirometer  An incentive spirometer is a tool that can help keep your lungs clear and active. This tool measures how well you are filling your lungs with each breath. Taking long deep breaths may help reverse or decrease the chance of developing breathing (pulmonary) problems (especially infection) following:  A long period of time when you are unable to move or be active. BEFORE THE PROCEDURE   If the spirometer includes an indicator to show your best effort, your nurse or respiratory therapist will set it to a desired goal.  If possible, sit up straight or lean slightly forward. Try not to slouch.  Hold the incentive spirometer in an upright position. INSTRUCTIONS FOR USE  1. Sit on the edge of your bed if possible, or sit up as far as you can in bed or on a chair. 2. Hold the incentive spirometer in an upright position. 3. Breathe out normally. 4. Place the mouthpiece in your mouth and seal your lips tightly around it. 5. Breathe in slowly and as deeply as possible, raising the piston or the ball toward the top of the column. 6. Hold your breath for 3-5 seconds or for as long as possible. Allow the piston or ball to fall to the bottom of the column. 7. Remove the mouthpiece from your mouth and breathe out normally. 8. Rest for a few seconds and repeat Steps 1 through 7 at least 10 times every 1-2 hours when you are awake.  Take your time and take a few normal breaths between deep breaths. 9. The spirometer may include an indicator to show your best effort. Use the indicator as a goal to work toward during each repetition. 10. After each set of 10 deep breaths, practice coughing to be sure your lungs are clear. If you have an incision (the cut made at the time of surgery), support your incision when coughing by placing a pillow or rolled up towels firmly against it. Once you are able to get out of bed, walk around indoors and cough well. You may stop using the incentive spirometer when instructed by your caregiver.  RISKS AND COMPLICATIONS  Take your time so you do not get dizzy or light-headed.  If you are in pain, you may need to take or ask for pain medication before doing incentive spirometry. It is harder to take a deep breath if you  are having pain. AFTER USE  Rest and breathe slowly and easily.  It can be helpful to keep track of a log of your progress. Your caregiver can provide you with a simple table to help with this. If you are using the spirometer at home, follow these instructions: [place] IF:   You are having difficultly using the spirometer.  You have trouble using the spirometer as often as instructed.  Your pain medication is not giving enough relief while using the spirometer.  You develop fever of 100.5 F (38.1 C) or higher. SEEK IMMEDIATE MEDICAL CARE IF:   You cough up bloody sputum that had not been present before.  You develop fever of 102 F (38.9 C) or greater.  You develop worsening pain at or near the incision site. MAKE SURE YOU:   Understand these instructions.  Will watch your condition.  Will get help right away if you are not doing well or get worse. Document Released: 06/09/2006 Document Revised: 04/21/2011 Document Reviewed: 08/10/2006 ExitCare Patient Information 2014 [place].   ________________________________________________________________________ , coughing and deep breathing exercises, leg exercises

## 2013-07-06 ENCOUNTER — Encounter (HOSPITAL_COMMUNITY): Payer: Self-pay

## 2013-07-06 ENCOUNTER — Encounter (HOSPITAL_COMMUNITY)
Admission: RE | Admit: 2013-07-06 | Discharge: 2013-07-06 | Disposition: A | Discharge status: Home / Self Care | Payer: Medicare Other | Source: Ambulatory Visit | Attending: Obstetrics & Gynecology | Admitting: Obstetrics & Gynecology

## 2013-07-06 ENCOUNTER — Ambulatory Visit (HOSPITAL_COMMUNITY)
Admission: RE | Admit: 2013-07-06 | Discharge: 2013-07-06 | Disposition: A | Discharge status: Home / Self Care | Payer: Medicare Other | Source: Ambulatory Visit | Attending: Gynecology | Admitting: Gynecology

## 2013-07-06 DIAGNOSIS — C549 Malignant neoplasm of corpus uteri, unspecified: Secondary | ICD-10-CM | POA: Diagnosis present

## 2013-07-06 HISTORY — DX: Malignant (primary) neoplasm, unspecified: C80.1

## 2013-07-06 HISTORY — DX: Unspecified osteoarthritis, unspecified site: M19.90

## 2013-07-06 HISTORY — DX: Major depressive disorder, single episode, unspecified: F32.9

## 2013-07-06 HISTORY — DX: Depression, unspecified: F32.A

## 2013-07-06 LAB — COMPREHENSIVE METABOLIC PANEL
ALK PHOS: 91 U/L (ref 39–117)
ALT: 16 U/L (ref 0–35)
AST: 21 U/L (ref 0–37)
Albumin: 3.9 g/dL (ref 3.5–5.2)
BUN: 8 mg/dL (ref 6–23)
CALCIUM: 9.8 mg/dL (ref 8.4–10.5)
CO2: 28 meq/L (ref 19–32)
Chloride: 101 mEq/L (ref 96–112)
Creatinine, Ser: 0.86 mg/dL (ref 0.50–1.10)
GFR calc Af Amer: 75 mL/min — ABNORMAL LOW (ref >90)
GFR calc non Af Amer: 64 mL/min — ABNORMAL LOW (ref >90)
Glucose, Bld: 227 mg/dL — ABNORMAL HIGH (ref 70–99)
POTASSIUM: 3.6 meq/L — AB (ref 3.7–5.3)
Sodium: 143 mEq/L (ref 137–147)
Total Bilirubin: 0.2 mg/dL — ABNORMAL LOW (ref 0.3–1.2)
Total Protein: 7.5 g/dL (ref 6.0–8.3)

## 2013-07-06 LAB — CBC WITH DIFFERENTIAL/PLATELET
BASOS ABS: 0 10*3/uL (ref 0.0–0.1)
Basophils Relative: 0 % (ref 0–1)
EOS PCT: 1 % (ref 0–5)
Eosinophils Absolute: 0 10*3/uL (ref 0.0–0.7)
HCT: 42.9 % (ref 36.0–46.0)
Hemoglobin: 13.7 g/dL (ref 12.0–15.0)
Lymphocytes Relative: 35 % (ref 12–46)
Lymphs Abs: 2 10*3/uL (ref 0.7–4.0)
MCH: 29.8 pg (ref 26.0–34.0)
MCHC: 31.9 g/dL (ref 30.0–36.0)
MCV: 93.3 fL (ref 78.0–100.0)
Monocytes Absolute: 0.5 10*3/uL (ref 0.1–1.0)
Monocytes Relative: 9 % (ref 3–12)
NEUTROS ABS: 3.2 10*3/uL (ref 1.7–7.7)
NEUTROS PCT: 55 % (ref 43–77)
PLATELETS: 243 10*3/uL (ref 150–400)
RBC: 4.6 MIL/uL (ref 3.87–5.11)
RDW: 12.9 % (ref 11.5–15.5)
WBC: 5.8 10*3/uL (ref 4.0–10.5)

## 2013-07-06 LAB — URINALYSIS, ROUTINE W REFLEX MICROSCOPIC
BILIRUBIN URINE: NEGATIVE
Glucose, UA: 500 mg/dL — AB
HGB URINE DIPSTICK: NEGATIVE
KETONES UR: NEGATIVE mg/dL
Nitrite: NEGATIVE
PH: 5.5 (ref 5.0–8.0)
Protein, ur: NEGATIVE mg/dL
SPECIFIC GRAVITY, URINE: 1.016 (ref 1.005–1.030)
Urobilinogen, UA: 0.2 mg/dL (ref 0.0–1.0)

## 2013-07-06 LAB — URINE MICROSCOPIC-ADD ON

## 2013-07-06 NOTE — Progress Notes (Signed)
Urinalysis with micro results faxed via EPIC to Dr Skeet Latch.

## 2013-07-11 NOTE — Progress Notes (Signed)
Patient called and had questions regarding medications to take am of surgery ( 07/12/13) and what to take today.  Instructed patient regarding clear liquid diet today ( which she understood) .  Patient was instructed to take medications as normally would take today.  Patient stated that she was told if she did not eat any solid food not to take diabetic medications.  Instructed patient that if she felt safer to not take diabetic medications today  due to the fact that she is on a clear liquid diet today to only take her other prescribed medications.  Patient voiced understanding.  Patient is also aware not to take any medications am of surgery on 07/12/2013.

## 2013-07-12 ENCOUNTER — Encounter (HOSPITAL_COMMUNITY): Payer: Self-pay | Admitting: *Deleted

## 2013-07-12 ENCOUNTER — Encounter (HOSPITAL_COMMUNITY): Payer: Medicare Other | Admitting: Anesthesiology

## 2013-07-12 ENCOUNTER — Ambulatory Visit (HOSPITAL_COMMUNITY)
Admission: RE | Admit: 2013-07-12 | Discharge: 2013-07-12 | DRG: 756 | Disposition: A | Discharge status: Home / Self Care | Payer: Medicare Other | Source: Ambulatory Visit | Attending: Obstetrics & Gynecology | Admitting: Obstetrics & Gynecology

## 2013-07-12 ENCOUNTER — Inpatient Hospital Stay (HOSPITAL_COMMUNITY): Payer: Medicare Other | Admitting: Anesthesiology

## 2013-07-12 DIAGNOSIS — C549 Malignant neoplasm of corpus uteri, unspecified: Secondary | ICD-10-CM | POA: Diagnosis present

## 2013-07-12 DIAGNOSIS — Z538 Procedure and treatment not carried out for other reasons: Secondary | ICD-10-CM

## 2013-07-12 DIAGNOSIS — C541 Malignant neoplasm of endometrium: Secondary | ICD-10-CM

## 2013-07-12 LAB — TYPE AND SCREEN
ABO/RH(D): O POS
Antibody Screen: NEGATIVE

## 2013-07-12 LAB — GLUCOSE, CAPILLARY: GLUCOSE-CAPILLARY: 134 mg/dL — AB (ref 70–99)

## 2013-07-12 LAB — ABO/RH: ABO/RH(D): O POS

## 2013-07-12 MED ORDER — PROPOFOL 10 MG/ML IV BOLUS
INTRAVENOUS | Status: AC
  Filled 2013-07-12: qty 20

## 2013-07-12 MED ORDER — ONDANSETRON HCL 4 MG/2ML IJ SOLN
INTRAMUSCULAR | Status: AC
  Filled 2013-07-12: qty 2

## 2013-07-12 MED ORDER — HEPARIN SODIUM (PORCINE) 5000 UNIT/ML IJ SOLN
5000.0000 [IU] | INTRAMUSCULAR | Status: AC
  Administered 2013-07-12: 5000 [IU] via SUBCUTANEOUS
  Filled 2013-07-12: qty 1

## 2013-07-12 MED ORDER — LACTATED RINGERS IV SOLN
INTRAVENOUS | Status: DC | PRN
  Administered 2013-07-12: 07:00:00 via INTRAVENOUS

## 2013-07-12 MED ORDER — ROCURONIUM BROMIDE 100 MG/10ML IV SOLN
INTRAVENOUS | Status: AC
  Filled 2013-07-12: qty 1

## 2013-07-12 MED ORDER — EPHEDRINE SULFATE 50 MG/ML IJ SOLN
INTRAMUSCULAR | Status: AC
  Filled 2013-07-12: qty 1

## 2013-07-12 MED ORDER — SODIUM CHLORIDE 0.9 % IJ SOLN
INTRAMUSCULAR | Status: AC
  Filled 2013-07-12: qty 10

## 2013-07-12 MED ORDER — LIDOCAINE HCL (CARDIAC) 20 MG/ML IV SOLN
INTRAVENOUS | Status: AC
  Filled 2013-07-12: qty 5

## 2013-07-12 MED ORDER — CEFAZOLIN SODIUM-DEXTROSE 2-3 GM-% IV SOLR: 2.0000 g | INTRAVENOUS | Status: DC

## 2013-07-12 MED ORDER — CEFAZOLIN SODIUM-DEXTROSE 2-3 GM-% IV SOLR
INTRAVENOUS | Status: AC
  Filled 2013-07-12: qty 50

## 2013-07-12 MED ORDER — FENTANYL CITRATE 0.05 MG/ML IJ SOLN
INTRAMUSCULAR | Status: AC
  Filled 2013-07-12: qty 5

## 2013-07-12 NOTE — Progress Notes (Signed)
Will need new orders for surg scheduled on 07/19/13 please - thank you

## 2013-07-12 NOTE — Progress Notes (Signed)
Patient back from holding. Procedure canceled due to robot down. Instructed to call Dr. Leone Brand office today and resched. Surgery for next week.Resume diet, activity and meds as prior to procedure.

## 2013-07-12 NOTE — Anesthesia Preprocedure Evaluation (Addendum)
Anesthesia Evaluation  Patient identified by MRN, date of birth, ID band Patient awake    Reviewed: Allergy & Precautions, H&P , NPO status , Patient's Chart, lab work & pertinent test results  Airway Mallampati: II TM Distance: >3 FB Neck ROM: Full    Dental no notable dental hx.    Pulmonary neg pulmonary ROS,  breath sounds clear to auscultation  Pulmonary exam normal       Cardiovascular hypertension, Pt. on medications Rhythm:Regular Rate:Normal     Neuro/Psych Schizophrenia negative neurological ROS     GI/Hepatic negative GI ROS, Neg liver ROS,   Endo/Other  diabetes  Renal/GU negative Renal ROS  negative genitourinary   Musculoskeletal negative musculoskeletal ROS (+)   Abdominal   Peds negative pediatric ROS (+)  Hematology negative hematology ROS (+)   Anesthesia Other Findings   Reproductive/Obstetrics negative OB ROS                         Anesthesia Physical Anesthesia Plan  ASA: III  Anesthesia Plan: General   Post-op Pain Management:    Induction: Intravenous  Airway Management Planned: Oral ETT  Additional Equipment:   Intra-op Plan:   Post-operative Plan: Extubation in OR  Informed Consent: I have reviewed the patients History and Physical, chart, labs and discussed the procedure including the risks, benefits and alternatives for the proposed anesthesia with the patient or authorized representative who has indicated his/her understanding and acceptance.   Dental advisory given  Plan Discussed with: CRNA  Anesthesia Plan Comments:         Anesthesia Quick Evaluation

## 2013-07-12 NOTE — Progress Notes (Signed)
Surgery Canceled due to mechanical issues. Patient to be discharged per Dr Skeet Latch, MD.  Surgery will be rescheduled.  Patient to call office.

## 2013-07-19 ENCOUNTER — Encounter (HOSPITAL_COMMUNITY): Payer: Medicare Other

## 2013-07-19 ENCOUNTER — Inpatient Hospital Stay (HOSPITAL_COMMUNITY): Payer: Medicare Other

## 2013-07-19 ENCOUNTER — Encounter (HOSPITAL_COMMUNITY): Payer: Self-pay | Admitting: *Deleted

## 2013-07-19 ENCOUNTER — Encounter (HOSPITAL_COMMUNITY)
Admission: RE | Disposition: A | Discharge status: Home / Self Care | Payer: Self-pay | Source: Ambulatory Visit | Attending: Obstetrics & Gynecology

## 2013-07-19 ENCOUNTER — Inpatient Hospital Stay (HOSPITAL_COMMUNITY)
Admission: RE | Admit: 2013-07-19 | Discharge: 2013-07-21 | DRG: 741 | Disposition: A | Discharge status: Home / Self Care | Payer: Medicare Other | Source: Ambulatory Visit | Attending: Obstetrics & Gynecology | Admitting: Obstetrics & Gynecology

## 2013-07-19 DIAGNOSIS — E119 Type 2 diabetes mellitus without complications: Secondary | ICD-10-CM | POA: Diagnosis present

## 2013-07-19 DIAGNOSIS — C541 Malignant neoplasm of endometrium: Secondary | ICD-10-CM | POA: Diagnosis present

## 2013-07-19 DIAGNOSIS — I10 Essential (primary) hypertension: Secondary | ICD-10-CM | POA: Diagnosis present

## 2013-07-19 DIAGNOSIS — Z79899 Other long term (current) drug therapy: Secondary | ICD-10-CM

## 2013-07-19 DIAGNOSIS — Z8249 Family history of ischemic heart disease and other diseases of the circulatory system: Secondary | ICD-10-CM

## 2013-07-19 DIAGNOSIS — C549 Malignant neoplasm of corpus uteri, unspecified: Principal | ICD-10-CM | POA: Diagnosis present

## 2013-07-19 DIAGNOSIS — Z7982 Long term (current) use of aspirin: Secondary | ICD-10-CM

## 2013-07-19 DIAGNOSIS — R339 Retention of urine, unspecified: Secondary | ICD-10-CM | POA: Diagnosis not present

## 2013-07-19 HISTORY — PX: ROBOTIC ASSISTED TOTAL HYSTERECTOMY WITH BILATERAL SALPINGO OOPHERECTOMY: SHX6086

## 2013-07-19 LAB — TYPE AND SCREEN
ABO/RH(D): O POS
Antibody Screen: NEGATIVE

## 2013-07-19 LAB — GLUCOSE, CAPILLARY
GLUCOSE-CAPILLARY: 134 mg/dL — AB (ref 70–99)
GLUCOSE-CAPILLARY: 193 mg/dL — AB (ref 70–99)
GLUCOSE-CAPILLARY: 171 mg/dL — AB (ref 70–99)
GLUCOSE-CAPILLARY: 220 mg/dL — AB (ref 70–99)

## 2013-07-19 SURGERY — ROBOTIC ASSISTED TOTAL HYSTERECTOMY WITH BILATERAL SALPINGO OOPHORECTOMY
Anesthesia: General | Laterality: Bilateral

## 2013-07-19 MED ORDER — AMLODIPINE BESYLATE 2.5 MG PO TABS
2.5000 mg | ORAL_TABLET | Freq: Every evening | ORAL | Status: DC
  Administered 2013-07-19 – 2013-07-20 (×2): 2.5 mg via ORAL
  Filled 2013-07-19 (×3): qty 1

## 2013-07-19 MED ORDER — MEPERIDINE HCL 50 MG/ML IJ SOLN: 6.2500 mg | INTRAMUSCULAR | Status: DC | PRN

## 2013-07-19 MED ORDER — CEFAZOLIN SODIUM-DEXTROSE 2-3 GM-% IV SOLR
2.0000 g | INTRAVENOUS | Status: AC
Start: 2013-07-19 — End: 2013-07-19
  Administered 2013-07-19: 2 g via INTRAVENOUS

## 2013-07-19 MED ORDER — STERILE WATER FOR IRRIGATION IR SOLN
Status: DC | PRN
  Administered 2013-07-19: 3000 mL

## 2013-07-19 MED ORDER — POLYVINYL ALCOHOL 1.4 % OP SOLN
1.0000 [drp] | Freq: Every day | OPHTHALMIC | Status: DC
  Administered 2013-07-19: 1 [drp] via OPHTHALMIC
  Filled 2013-07-19: qty 15

## 2013-07-19 MED ORDER — ROCURONIUM BROMIDE 100 MG/10ML IV SOLN
INTRAVENOUS | Status: AC
  Filled 2013-07-19: qty 1

## 2013-07-19 MED ORDER — SODIUM CHLORIDE 0.9 % IJ SOLN
INTRAMUSCULAR | Status: AC
  Filled 2013-07-19: qty 10

## 2013-07-19 MED ORDER — MIDAZOLAM HCL 2 MG/2ML IJ SOLN
INTRAMUSCULAR | Status: AC
  Filled 2013-07-19: qty 2

## 2013-07-19 MED ORDER — ONDANSETRON HCL 4 MG/2ML IJ SOLN: 4.0000 mg | Freq: Four times a day (QID) | INTRAMUSCULAR | Status: DC | PRN

## 2013-07-19 MED ORDER — ATORVASTATIN CALCIUM 20 MG PO TABS
20.0000 mg | ORAL_TABLET | Freq: Every day | ORAL | Status: DC
  Administered 2013-07-20: 20 mg via ORAL
  Filled 2013-07-19 (×3): qty 1

## 2013-07-19 MED ORDER — LACTATED RINGERS IV SOLN: INTRAVENOUS | Status: DC

## 2013-07-19 MED ORDER — PROPOFOL 10 MG/ML IV BOLUS
INTRAVENOUS | Status: AC
Start: 2013-07-19 — End: 2013-07-19
  Filled 2013-07-19: qty 20

## 2013-07-19 MED ORDER — ONDANSETRON HCL 4 MG PO TABS: 4.0000 mg | ORAL_TABLET | Freq: Four times a day (QID) | ORAL | Status: DC | PRN

## 2013-07-19 MED ORDER — HYDROMORPHONE HCL PF 1 MG/ML IJ SOLN
0.5000 mg | Freq: Four times a day (QID) | INTRAMUSCULAR | Status: DC | PRN
  Administered 2013-07-20: 0.5 mg via INTRAVENOUS
  Filled 2013-07-19: qty 1

## 2013-07-19 MED ORDER — MIDAZOLAM HCL 5 MG/5ML IJ SOLN
INTRAMUSCULAR | Status: DC | PRN
  Administered 2013-07-19 (×2): 1 mg via INTRAVENOUS

## 2013-07-19 MED ORDER — INSULIN ASPART 100 UNIT/ML ~~LOC~~ SOLN
0.0000 [IU] | Freq: Three times a day (TID) | SUBCUTANEOUS | Status: DC
  Administered 2013-07-19: 3 [IU] via SUBCUTANEOUS
  Administered 2013-07-20: 2 [IU] via SUBCUTANEOUS

## 2013-07-19 MED ORDER — EPHEDRINE SULFATE 50 MG/ML IJ SOLN
INTRAMUSCULAR | Status: DC | PRN
  Administered 2013-07-19: 15 mg via INTRAVENOUS
  Administered 2013-07-19 (×3): 10 mg via INTRAVENOUS

## 2013-07-19 MED ORDER — PROMETHAZINE HCL 25 MG/ML IJ SOLN
INTRAMUSCULAR | Status: AC
  Filled 2013-07-19: qty 1

## 2013-07-19 MED ORDER — NEOSTIGMINE METHYLSULFATE 10 MG/10ML IV SOLN
INTRAVENOUS | Status: AC
  Filled 2013-07-19: qty 1

## 2013-07-19 MED ORDER — PHENYLEPHRINE 40 MCG/ML (10ML) SYRINGE FOR IV PUSH (FOR BLOOD PRESSURE SUPPORT)
PREFILLED_SYRINGE | INTRAVENOUS | Status: AC
  Filled 2013-07-19: qty 10

## 2013-07-19 MED ORDER — ONDANSETRON HCL 4 MG/2ML IJ SOLN
INTRAMUSCULAR | Status: DC | PRN
  Administered 2013-07-19: 4 mg via INTRAVENOUS

## 2013-07-19 MED ORDER — LACTATED RINGERS IR SOLN
Status: DC | PRN
  Administered 2013-07-19: 1000 mL

## 2013-07-19 MED ORDER — OXYCODONE-ACETAMINOPHEN 5-325 MG PO TABS
1.0000 | ORAL_TABLET | ORAL | Status: DC | PRN
  Administered 2013-07-20: 1 via ORAL
  Filled 2013-07-19: qty 1

## 2013-07-19 MED ORDER — INSULIN ASPART 100 UNIT/ML ~~LOC~~ SOLN
4.0000 [IU] | Freq: Once | SUBCUTANEOUS | Status: AC
  Administered 2013-07-19: 4 [IU] via SUBCUTANEOUS

## 2013-07-19 MED ORDER — CEFAZOLIN SODIUM-DEXTROSE 2-3 GM-% IV SOLR
INTRAVENOUS | Status: AC
  Filled 2013-07-19: qty 50

## 2013-07-19 MED ORDER — POLYETHYL GLYCOL-PROPYL GLYCOL 0.4-0.3 % OP SOLN: 1.0000 [drp] | Freq: Every day | OPHTHALMIC | Status: DC

## 2013-07-19 MED ORDER — PROMETHAZINE HCL 25 MG/ML IJ SOLN
6.2500 mg | INTRAMUSCULAR | Status: DC | PRN
  Administered 2013-07-19: 6.25 mg via INTRAVENOUS

## 2013-07-19 MED ORDER — GLYCOPYRROLATE 0.2 MG/ML IJ SOLN
INTRAMUSCULAR | Status: DC | PRN
  Administered 2013-07-19: 0.6 mg via INTRAVENOUS

## 2013-07-19 MED ORDER — HEPARIN SODIUM (PORCINE) 5000 UNIT/ML IJ SOLN
5000.0000 [IU] | INTRAMUSCULAR | Status: AC
  Administered 2013-07-19: 5000 [IU] via SUBCUTANEOUS
  Filled 2013-07-19: qty 1

## 2013-07-19 MED ORDER — FENTANYL CITRATE 0.05 MG/ML IJ SOLN: 25.0000 ug | INTRAMUSCULAR | Status: DC | PRN

## 2013-07-19 MED ORDER — LIDOCAINE HCL (CARDIAC) 20 MG/ML IV SOLN
INTRAVENOUS | Status: AC
  Filled 2013-07-19: qty 5

## 2013-07-19 MED ORDER — KETOROLAC TROMETHAMINE 30 MG/ML IJ SOLN
15.0000 mg | Freq: Four times a day (QID) | INTRAMUSCULAR | Status: AC
  Administered 2013-07-19 – 2013-07-20 (×4): 15 mg via INTRAVENOUS
  Filled 2013-07-19 (×6): qty 1

## 2013-07-19 MED ORDER — KETOROLAC TROMETHAMINE 30 MG/ML IJ SOLN
15.0000 mg | Freq: Four times a day (QID) | INTRAMUSCULAR | Status: AC
  Filled 2013-07-19 (×4): qty 1

## 2013-07-19 MED ORDER — GLYCOPYRROLATE 0.2 MG/ML IJ SOLN
INTRAMUSCULAR | Status: AC
  Filled 2013-07-19: qty 3

## 2013-07-19 MED ORDER — HYDROMORPHONE HCL PF 1 MG/ML IJ SOLN
0.2500 mg | INTRAMUSCULAR | Status: DC | PRN
  Administered 2013-07-19: 0.5 mg via INTRAVENOUS

## 2013-07-19 MED ORDER — KCL IN DEXTROSE-NACL 20-5-0.45 MEQ/L-%-% IV SOLN
INTRAVENOUS | Status: DC
  Administered 2013-07-19: 17:00:00 via INTRAVENOUS
  Filled 2013-07-19 (×3): qty 1000

## 2013-07-19 MED ORDER — PROPOFOL 10 MG/ML IV BOLUS
INTRAVENOUS | Status: DC | PRN
  Administered 2013-07-19: 20 mg via INTRAVENOUS
  Administered 2013-07-19: 160 mg via INTRAVENOUS

## 2013-07-19 MED ORDER — ONDANSETRON HCL 4 MG/2ML IJ SOLN
INTRAMUSCULAR | Status: AC
  Filled 2013-07-19: qty 2

## 2013-07-19 MED ORDER — HEPARIN SODIUM (PORCINE) 1000 UNIT/ML IJ SOLN
INTRAMUSCULAR | Status: AC
  Filled 2013-07-19: qty 1

## 2013-07-19 MED ORDER — PHENYLEPHRINE HCL 10 MG/ML IJ SOLN
INTRAMUSCULAR | Status: DC | PRN
  Administered 2013-07-19 (×3): 40 ug via INTRAVENOUS
  Administered 2013-07-19: 120 ug via INTRAVENOUS

## 2013-07-19 MED ORDER — INSULIN ASPART 100 UNIT/ML ~~LOC~~ SOLN
SUBCUTANEOUS | Status: AC
  Filled 2013-07-19: qty 1

## 2013-07-19 MED ORDER — LIDOCAINE HCL (CARDIAC) 20 MG/ML IV SOLN
INTRAVENOUS | Status: DC | PRN
  Administered 2013-07-19: 50 mg via INTRAVENOUS

## 2013-07-19 MED ORDER — ROCURONIUM BROMIDE 100 MG/10ML IV SOLN
INTRAVENOUS | Status: DC | PRN
  Administered 2013-07-19: 10 mg via INTRAVENOUS
  Administered 2013-07-19: 20 mg via INTRAVENOUS
  Administered 2013-07-19: 50 mg via INTRAVENOUS

## 2013-07-19 MED ORDER — PROMETHAZINE HCL 25 MG/ML IJ SOLN: 6.2500 mg | INTRAMUSCULAR | Status: DC | PRN

## 2013-07-19 MED ORDER — ASPIRIN 81 MG PO CHEW
81.0000 mg | CHEWABLE_TABLET | Freq: Every day | ORAL | Status: DC
  Administered 2013-07-20 – 2013-07-21 (×2): 81 mg via ORAL
  Filled 2013-07-19 (×2): qty 1

## 2013-07-19 MED ORDER — THIORIDAZINE HCL 25 MG PO TABS
75.0000 mg | ORAL_TABLET | Freq: Every day | ORAL | Status: DC
  Administered 2013-07-20: 75 mg via ORAL
  Filled 2013-07-19 (×3): qty 1

## 2013-07-19 MED ORDER — HYDROMORPHONE HCL PF 1 MG/ML IJ SOLN
INTRAMUSCULAR | Status: AC
  Filled 2013-07-19: qty 1

## 2013-07-19 MED ORDER — ENOXAPARIN SODIUM 40 MG/0.4ML ~~LOC~~ SOLN
40.0000 mg | SUBCUTANEOUS | Status: DC
  Administered 2013-07-20: 40 mg via SUBCUTANEOUS
  Filled 2013-07-19: qty 0.4

## 2013-07-19 MED ORDER — FENTANYL CITRATE 0.05 MG/ML IJ SOLN
INTRAMUSCULAR | Status: DC | PRN
  Administered 2013-07-19: 50 ug via INTRAVENOUS
  Administered 2013-07-19: 100 ug via INTRAVENOUS
  Administered 2013-07-19 (×2): 50 ug via INTRAVENOUS

## 2013-07-19 MED ORDER — LACTATED RINGERS IV SOLN
INTRAVENOUS | Status: DC | PRN
  Administered 2013-07-19 (×3): via INTRAVENOUS

## 2013-07-19 MED ORDER — NEOSTIGMINE METHYLSULFATE 10 MG/10ML IV SOLN
INTRAVENOUS | Status: DC | PRN
  Administered 2013-07-19: 4 mg via INTRAVENOUS

## 2013-07-19 MED ORDER — FENTANYL CITRATE 0.05 MG/ML IJ SOLN
INTRAMUSCULAR | Status: AC
  Filled 2013-07-19: qty 5

## 2013-07-19 MED ORDER — EPHEDRINE SULFATE 50 MG/ML IJ SOLN
INTRAMUSCULAR | Status: AC
  Filled 2013-07-19: qty 1

## 2013-07-19 SURGICAL SUPPLY — 62 items
APL SKNCLS STERI-STRIP NONHPOA (GAUZE/BANDAGES/DRESSINGS) ×1
BAG SPEC RTRVL LRG 6X4 10 (ENDOMECHANICALS) ×2
BENZOIN TINCTURE PRP APPL 2/3 (GAUZE/BANDAGES/DRESSINGS) ×3 IMPLANT
CABLE HIGH FREQUENCY MONO STRZ (ELECTRODE) ×3 IMPLANT
CHLORAPREP W/TINT 26ML (MISCELLANEOUS) ×3 IMPLANT
CLOSURE WOUND 1/2 X4 (GAUZE/BANDAGES/DRESSINGS) ×1
CORDS BIPOLAR (ELECTRODE) ×3 IMPLANT
COVER MAYO STAND STRL (DRAPES) ×1 IMPLANT
COVER SURGICAL LIGHT HANDLE (MISCELLANEOUS) ×3 IMPLANT
COVER TIP SHEARS 8 DVNC (MISCELLANEOUS) ×1 IMPLANT
COVER TIP SHEARS 8MM DA VINCI (MISCELLANEOUS) ×2
DRAPE CAMERA CLOSED 9X96 (DRAPES) ×2 IMPLANT
DRAPE LG THREE QUARTER DISP (DRAPES) ×6 IMPLANT
DRAPE SURG IRRIG POUCH 19X23 (DRAPES) ×3 IMPLANT
DRAPE TABLE BACK 44X90 PK DISP (DRAPES) ×6 IMPLANT
DRAPE UTILITY XL STRL (DRAPES) ×3 IMPLANT
DRAPE WARM FLUID 44X44 (DRAPE) ×3 IMPLANT
DRSG TEGADERM 2-3/8X2-3/4 SM (GAUZE/BANDAGES/DRESSINGS) ×11 IMPLANT
DRSG TEGADERM 4X4.75 (GAUZE/BANDAGES/DRESSINGS) ×3 IMPLANT
DRSG TEGADERM 6X8 (GAUZE/BANDAGES/DRESSINGS) ×8 IMPLANT
ELECT REM PT RETURN 9FT ADLT (ELECTROSURGICAL) ×3
ELECTRODE REM PT RTRN 9FT ADLT (ELECTROSURGICAL) ×1 IMPLANT
GAUZE SPONGE 2X2 8PLY STRL LF (GAUZE/BANDAGES/DRESSINGS) ×2 IMPLANT
GLOVE BIO SURGEON STRL SZ 6.5 (GLOVE) ×8 IMPLANT
GLOVE BIO SURGEON STRL SZ7.5 (GLOVE) ×6 IMPLANT
GLOVE BIO SURGEONS STRL SZ 6.5 (GLOVE) ×4
GLOVE INDICATOR 8.0 STRL GRN (GLOVE) ×8 IMPLANT
GOWN STRL NON-REIN LRG LVL3 (GOWN DISPOSABLE) ×1 IMPLANT
GOWN STRL REUS W/ TWL LRG LVL3 (GOWN DISPOSABLE) IMPLANT
GOWN STRL REUS W/TWL LRG LVL3 (GOWN DISPOSABLE) ×5 IMPLANT
GOWN STRL REUS W/TWL XL LVL3 (GOWN DISPOSABLE) ×10 IMPLANT
HOLDER FOLEY CATH W/STRAP (MISCELLANEOUS) ×3 IMPLANT
KIT ACCESSORY DA VINCI DISP (KITS) ×2
KIT ACCESSORY DVNC DISP (KITS) ×1 IMPLANT
KIT BASIN OR (CUSTOM PROCEDURE TRAY) ×3 IMPLANT
MANIPULATOR UTERINE 4.5 ZUMI (MISCELLANEOUS) ×3 IMPLANT
OCCLUDER COLPOPNEUMO (BALLOONS) ×3 IMPLANT
POUCH SPECIMEN RETRIEVAL 10MM (ENDOMECHANICALS) ×6 IMPLANT
SET TUBE IRRIG SUCTION NO TIP (IRRIGATION / IRRIGATOR) ×3 IMPLANT
SHEET LAVH (DRAPES) ×3 IMPLANT
SOLUTION ANTI FOG 6CC (MISCELLANEOUS) ×2 IMPLANT
SOLUTION ELECTROLUBE (MISCELLANEOUS) ×3 IMPLANT
SPONGE GAUZE 2X2 STER 10/PKG (GAUZE/BANDAGES/DRESSINGS)
SPONGE LAP 18X18 X RAY DECT (DISPOSABLE) IMPLANT
STRIP CLOSURE SKIN 1/2X4 (GAUZE/BANDAGES/DRESSINGS) ×2 IMPLANT
SUT VIC AB 0 CT1 27 (SUTURE)
SUT VIC AB 0 CT1 27XBRD ANTBC (SUTURE) ×1 IMPLANT
SUT VIC AB 4-0 PS2 27 (SUTURE) ×6 IMPLANT
SUT VICRYL 0 UR6 27IN ABS (SUTURE) ×1 IMPLANT
SUT VLOC 180 2-0 9IN GS21 (SUTURE) ×2 IMPLANT
SYR 50ML LL SCALE MARK (SYRINGE) ×3 IMPLANT
SYR BULB IRRIGATION 50ML (SYRINGE) IMPLANT
TOWEL OR 17X26 10 PK STRL BLUE (TOWEL DISPOSABLE) ×4 IMPLANT
TOWEL OR NON WOVEN STRL DISP B (DISPOSABLE) ×3 IMPLANT
TRAP SPECIMEN MUCOUS 40CC (MISCELLANEOUS) IMPLANT
TRAY FOLEY CATH 14FRSI W/METER (CATHETERS) ×3 IMPLANT
TRAY LAP CHOLE (CUSTOM PROCEDURE TRAY) ×3 IMPLANT
TROCAR 12M 150ML BLUNT (TROCAR) ×3 IMPLANT
TROCAR BLADELESS OPT 5 75 (ENDOMECHANICALS) ×3 IMPLANT
TROCAR XCEL 12X100 BLDLESS (ENDOMECHANICALS) ×3 IMPLANT
TUBING INSUFFLATION 10FT LAP (TUBING) ×3 IMPLANT
WATER STERILE IRR 1500ML POUR (IV SOLUTION) ×2 IMPLANT

## 2013-07-19 NOTE — H&P (View-Only) (Signed)
Consult Note: Gyn-Onc   Michelle Macdonald 78 y.o. female  Chief Complaint  Patient presents with  . endo ca    Assessment : 3 endometrial carcinoma  Plan: I recommend the patient undergo a robotic assisted hysterectomy, bilateral salpingo-oophorectomy, and pelvic and para-aortic lymphadenectomy. We will schedule surgery for June 2. Patient understands and Dr. [name] will be the surgeon. The risks of surgery were reviewed with the patient and her daughter and sister and questions are answered.   HPI: 78 year old (female seen in consultation request of [name] regarding management of a newly diagnosed endometrial adenocarcinoma.  Patient went through menopause in her early 12s and had no bleeding until approximately a month ago when she had bleeding for 2 weeks. The bleeding has stopped. Patient saw [name] on 06/09/2013. An endometrial biopsy was obtained revealing a grade 3 endometrial adenocarcinoma.  Patient denies any other pelvic symptoms including pain or pressure. She has no past gynecologic history. She has one cousin who had endometrial carcinoma. She denies any family history of colon cancer..  Review of Systems:10 point review of systems is negative except as noted in interval history.   Vitals: Blood pressure 166/80, pulse 90, temperature 98.3 F (36.8 C), temperature source Oral, resp. rate 18, height 5\' 1"  (1.549 m), weight 169 lb 6.4 oz (76.839 kg).  Physical Exam: General : The patient is a healthy woman in no acute distress.  HEENT: normocephalic, extraoccular movements normal; neck is supple without thyromegally  Lynphnodes: Supraclavicular and inguinal nodes not enlarged  Abdomen: Soft, non-tender, no ascites, no organomegally, no masses, no hernias  Pelvic:  EGBUS: Normal female  Vagina: Normal, no lesions  Urethra and Bladder: Normal, non-tender  Cervix: Normal there is no bleeding at the present time. Uterus: Difficult to delineate  secondary to the patient's habitus but I believe essentially normal size.  Bi-manual examination: Non-tender; no adenxal masses or nodularity  Rectal: normal sphincter tone, no masses, no blood  Lower extremities: No edema or varicosities. Normal range of motion      No Known Allergies  Past Medical History  Diagnosis Date  . Hypertension   . Diabetes mellitus     History reviewed. No pertinent past surgical history.  Current Outpatient Prescriptions  Medication Sig Dispense Refill  . amLODipine (NORVASC) 10 MG tablet Take 10 mg by mouth daily.      Marland Kitchen aspirin 81 MG tablet Take 81 mg by mouth daily.      Marland Kitchen atorvastatin (LIPITOR) 20 MG tablet Take 20 mg by mouth daily.      . meclizine (ANTIVERT) 50 MG tablet Take 1 tablet (50 mg total) by mouth 3 (three) times daily as needed.  30 tablet  0  . metFORMIN (GLUCOPHAGE) 500 MG tablet Take 500 mg by mouth 2 (two) times daily with a meal.      . ondansetron (ZOFRAN) 4 MG tablet Take 1 tablet (4 mg total) by mouth every 6 (six) hours.  12 tablet  0  . thioridazine (MELLARIL) 25 MG tablet Take 75 mg by mouth at bedtime.       Marland Kitchen thioridazine (MELLARIL) 25 MG tablet Take 25 mg by mouth 3 (three) times daily.       No current facility-administered medications for this visit.    History   Social History  . Marital Status: Widowed    Spouse Name: N/A    Number of Children: N/A  . Years of Education: N/A   Occupational History  .  Not on file.   Social History Main Topics  . Smoking status: Never Smoker   . Smokeless tobacco: Never Used  . Alcohol Use: No  . Drug Use: No  . Sexual Activity: No   Other Topics Concern  . Not on file   Social History Narrative  . No narrative on file    Family History  Problem Relation Age of Onset  . Heart disease Mother   . Heart disease Sister       Alvino Chapel, MD 06/29/2013, 1:51 PM

## 2013-07-19 NOTE — Interval H&P Note (Signed)
History and Physical Interval Note:  07/19/2013 8:11 AM  Michelle Macdonald  has presented today for surgery, with the diagnosis of endometrial cancer  The various methods of treatment have been discussed with the patient and family. After consideration of risks, benefits and other options for treatment, the patient has consented to  Procedure(s): ROBOTIC ASSISTED TOTAL HYSTERECTOMY WITH BILATERAL SALPINGO OOPHORECTOMY and lymph node dissecton bilateral (Bilateral) as a surgical intervention .  The patient's history has been reviewed, patient examined, no change in status, stable for surgery.  I have reviewed the patient's chart and labs.  Questions were answered to the patient's satisfaction.     [name]

## 2013-07-19 NOTE — Progress Notes (Signed)
Peripad put in place.

## 2013-07-19 NOTE — Progress Notes (Signed)
Peripade has small amount of bloody drainage on it

## 2013-07-19 NOTE — Anesthesia Preprocedure Evaluation (Signed)
Anesthesia Evaluation  Patient identified by MRN, date of birth, ID band Patient awake    Reviewed: Allergy & Precautions, H&P , NPO status , Patient's Chart, lab work & pertinent test results  Airway Mallampati: II TM Distance: >3 FB Neck ROM: Full    Dental no notable dental hx.    Pulmonary neg pulmonary ROS,  breath sounds clear to auscultation  Pulmonary exam normal       Cardiovascular hypertension, Pt. on medications Rhythm:Regular Rate:Normal     Neuro/Psych negative neurological ROS  negative psych ROS   GI/Hepatic negative GI ROS, Neg liver ROS,   Endo/Other  diabetes  Renal/GU negative Renal ROS  negative genitourinary   Musculoskeletal negative musculoskeletal ROS (+)   Abdominal   Peds negative pediatric ROS (+)  Hematology negative hematology ROS (+)   Anesthesia Other Findings   Reproductive/Obstetrics negative OB ROS                           Anesthesia Physical Anesthesia Plan  ASA: III  Anesthesia Plan: General   Post-op Pain Management:    Induction: Intravenous  Airway Management Planned: Oral ETT  Additional Equipment:   Intra-op Plan:   Post-operative Plan: Extubation in OR  Informed Consent: I have reviewed the patients History and Physical, chart, labs and discussed the procedure including the risks, benefits and alternatives for the proposed anesthesia with the patient or authorized representative who has indicated his/her understanding and acceptance.   Dental advisory given  Plan Discussed with: CRNA and Surgeon  Anesthesia Plan Comments:         Anesthesia Quick Evaluation

## 2013-07-19 NOTE — Transfer of Care (Signed)
Immediate Anesthesia Transfer of Care Note  Patient: Michelle Macdonald  Procedure(s) Performed: Procedure(s): ROBOTIC ASSISTED TOTAL HYSTERECTOMY WITH BILATERAL SALPINGO OOPHORECTOMY and lymph node dissecton bilateral (Bilateral)  Patient Location: PACU  Anesthesia Type:General  Level of Consciousness: awake, alert  and oriented  Airway & Oxygen Therapy: Patient Spontanous Breathing and Patient connected to face mask oxygen  Post-op Assessment: Report given to PACU RN and Post -op Vital signs reviewed and stable  Post vital signs: Reviewed and stable  Complications: No apparent anesthesia complications

## 2013-07-19 NOTE — Anesthesia Postprocedure Evaluation (Signed)
  Anesthesia Post-op Note  Patient: Michelle Macdonald  Procedure(s) Performed: Procedure(s) (LRB): ROBOTIC ASSISTED TOTAL HYSTERECTOMY WITH BILATERAL SALPINGO OOPHORECTOMY and lymph node dissecton bilateral (Bilateral)  Patient Location: PACU  Anesthesia Type: General  Level of Consciousness: awake and alert   Airway and Oxygen Therapy: Patient Spontanous Breathing  Post-op Pain: mild  Post-op Assessment: Post-op Vital signs reviewed, Patient's Cardiovascular Status Stable, Respiratory Function Stable, Patent Airway and No signs of Nausea or vomiting  Last Vitals:  Filed Vitals:   07/19/13 1230  BP: 129/64  Pulse: 63  Temp:   Resp: 10    Post-op Vital Signs: stable   Complications: No apparent anesthesia complications

## 2013-07-19 NOTE — Op Note (Signed)
Preoperative Diagnosis: Grade 3 endometrial adenocarcinoma  Postoperative Diagnosis: Grade 3endometrial adenocarcinoma  Procedure(s) Performed: Robotic total laparoscopic hysterectomy, Bilateral salpingo oophorectomy,  Bilateral pelvic lymph node dissection right para-aortic lymph node dissection  Anesthesia: GET  Surgeon: Francetta Found.  Skeet Latch, M.D. PhD  Assistant Surgeon:Lisa Delsa Sale MD.   Specimens: Uterus cervix, ovaries tubes bilateral pelvic lymph nodes, right para-aortic lymph nodes  Estimated Blood Loss: 159mL.   Complications: none  Indication for Procedure: This is a 78 y.o. age-old who underwent prior endometrial assessment demonstrating grade 3 endometrial cancer.  Operative Findings:  10 cm uterus. Normal adnexa. No masses.  Cul de sac and left lower quadrant filmy adhesions.   Procedure: Patient was taken to the operating room and placed under general endotracheal anesthesia without any difficulty. She is placed in the dorsal lithotomy position and secured to the operative table over the chest with tape.   The patient was prepped and draped and the uterine manipulator placed within the endometrial cavity. The appropriately sized Koh ring was circumferentially around the cervix. The balloon was placed within the vagina. An OG tube was present and functional. At an area on the left in line with the nipple approximately 2 cm below the ribs the area was infiltrated with 1% lidocaine and a 5 mm Optiview inserted under direct visualization. The abdomen was insufflated to 15 mm of mercury and the pressure never deviated above that throughout the remainder of the procedure. Maximum Trendelenburg positioning was obtained. At approximately 24 cm proximal to the symphysis pubis an incision was made superior to the umbilicus. Incisions were also made  10 cm lateral to this incision  Bilaterally and in the right upper quadrant.  The camera was placed  In the midline incision. The right  upper quadrant port site was replaced with a 10 mm port. This was all completed under direct visualization.  63 Millimeter robotic ports were placed in the other 3 incisions.  The small and large bowel were reflected as much as possible into the upper abdomen so that the root of the aorta was visible. The robot was docked and instruments placed.  An incision was made in the peritoneum overlying the right common iliac artery and the right ureter was identified and deviated laterally away from the operative field. Nodal tissue overlying the lower aorta the vena cava and psoas were removed to the level of the IMA.  A raytec was used to facilitate hemostasis.  This  was removed through the vagina at the end of the case. The right para-aortic lymph nodes were removed from the port site.  Right pelvic lymph node dissection was then initiated. The superior vesicle artery was identified and the vesicouterine space developed.  The right ureter was identified and the right IP cauterized and transected.  The obturator nerve was identified. Nodal tissue was removed within the boundaries of the right genitofemoral nerve the right circumflex vein, the ureter and the superior vesicle artery.  The nodal tissue was placed in an Endo Catch bag. The left pelvic lymph node dissection was then initiated. The superior vesicle artery was identified and the vesicouterine space developed. The left ureter was identified and the left IP cauterized and transected.  The obturator nerve was identified. Nodal tissue was removed within the boundaries of the left  genitofemoral nerve the left circumflex vein, the ureter and the superior vesicle artery. The nodal tissue was placed in an Endo Catch bag.   The right round ligament was transected and the  ureter was identified. The right infundibulopelvic ligament was cauterized and transected The retroperitoneal space was entered on the right and the peritoneum incised to the level of the  vesicouterine ligament anteriorly. The bladder flap was created using Bovie cautery. The peritoneal dissection was continued inferiorly and across the inferior most aspect of the cervix. In this manner the urethra was deflected inferiorly. The bladder flap was further developed. The uterine vessels on the right were skeletonized ligated and transected.  The left ureter was identified. The broad ligament was skeletonized posteriorly to the level of the cervix and the peritoneum dissected free from the cervix and in this fashion the ureter was deflected inferiorly. The anterior peritoneum was further dissected and the bladder flap appropriately developed. The uterine vessels were skeletonized cauterized and transected. A colpotomy incision was made circumferentially and the uterus cervix ovaries and tubes were ivered from the vagina. The Koh ring was removed as were the nodal specimens and the balloon was replaced.  The pelvis was copiously irrigated and drained and hemostasis was assured. The vaginal cuff was closed with a running 0 Vicryl suture ligature. The needle was removed under direct visualization. The operative site is once again visualized and hemostasis was assured. The instruments were removed from the abdomen and pelvis and the port sites irrigated. The umbilical subcutaneous tissue was closed with an interrupted 0 Vicryl  suture. The subcutaneous tissue of the right  upper quadrant port site was approximated with a single suture. Skin incisions were closed with a subcuticular suture.  The vaginal vault was cleared with a moist sponge stick.  Sponge, lap and needle counts were correct x 3.    The patient had sequential compression devices and preoperative heparin for VTE prophylaxis and will receive Lovenox postoperatively.          Disposition: PACU - hemodynamically stable.         Condition:stable Foley draining clear urine.

## 2013-07-19 NOTE — Care Management Note (Signed)
    Page 1 of 1   07/19/2013     3:55:57 PM CARE MANAGEMENT NOTE 07/19/2013  Patient:  RAYVIN, ABID   Account Number:  [id number]  Date Initiated:  07/19/2013  Documentation initiated by:  Sunday Spillers  Subjective/Objective Assessment:   78 yo female admitted s/p robotic TAH. PTA lived at home alone.     Action/Plan:   Home when stable   Anticipated DC Date:  07/19/2013   Anticipated DC Plan:  [place]  CM consult      Choice offered to / List presented to:             Status of service:  Completed, signed off Medicare Important Message given?  NA - LOS <3 / Initial given by admissions (If response is "NO", the following Medicare IM given date fields will be blank) Date Medicare IM given:   Date Additional Medicare IM given:    Discharge Disposition:  HOME/SELF CARE  Per UR Regulation:  Reviewed for med. necessity/level of care/duration of stay  If discussed at [place] of Stay Meetings, dates discussed:    Comments:

## 2013-07-19 NOTE — Progress Notes (Signed)
Utilization review completed.  

## 2013-07-20 ENCOUNTER — Encounter (HOSPITAL_COMMUNITY): Payer: Self-pay | Admitting: Gynecologic Oncology

## 2013-07-20 LAB — URINE MICROSCOPIC-ADD ON

## 2013-07-20 LAB — BASIC METABOLIC PANEL
BUN: 7 mg/dL (ref 6–23)
BUN: 13 mg/dL (ref 6–23)
CHLORIDE: 100 meq/L (ref 96–112)
CO2: 29 mEq/L (ref 19–32)
CO2: 27 mEq/L (ref 19–32)
CREATININE: 0.91 mg/dL (ref 0.50–1.10)
CREATININE: 1.48 mg/dL — AB (ref 0.50–1.10)
Calcium: 8.7 mg/dL (ref 8.4–10.5)
Calcium: 9 mg/dL (ref 8.4–10.5)
Chloride: 104 mEq/L (ref 96–112)
GFR calc non Af Amer: 60 mL/min — ABNORMAL LOW (ref >90)
GFR calc non Af Amer: 33 mL/min — ABNORMAL LOW (ref >90)
GFR, EST AFRICAN AMERICAN: 70 mL/min — AB (ref >90)
GFR, EST AFRICAN AMERICAN: 39 mL/min — AB (ref >90)
Glucose, Bld: 170 mg/dL — ABNORMAL HIGH (ref 70–99)
Glucose, Bld: 234 mg/dL — ABNORMAL HIGH (ref 70–99)
POTASSIUM: 4.1 meq/L (ref 3.7–5.3)
Potassium: 3.7 mEq/L (ref 3.7–5.3)
Sodium: 143 mEq/L (ref 137–147)
Sodium: 140 mEq/L (ref 137–147)

## 2013-07-20 LAB — CBC
HCT: 35.5 % — ABNORMAL LOW (ref 36.0–46.0)
HCT: 36.7 % (ref 36.0–46.0)
Hemoglobin: 11.1 g/dL — ABNORMAL LOW (ref 12.0–15.0)
Hemoglobin: 11.7 g/dL — ABNORMAL LOW (ref 12.0–15.0)
MCH: 29.5 pg (ref 26.0–34.0)
MCH: 29.8 pg (ref 26.0–34.0)
MCHC: 31.3 g/dL (ref 30.0–36.0)
MCHC: 31.9 g/dL (ref 30.0–36.0)
MCV: 94.4 fL (ref 78.0–100.0)
MCV: 93.6 fL (ref 78.0–100.0)
Platelets: 202 10*3/uL (ref 150–400)
Platelets: 199 10*3/uL (ref 150–400)
RBC: 3.76 MIL/uL — ABNORMAL LOW (ref 3.87–5.11)
RBC: 3.92 MIL/uL (ref 3.87–5.11)
RDW: 13.3 % (ref 11.5–15.5)
RDW: 13.4 % (ref 11.5–15.5)
WBC: 9 10*3/uL (ref 4.0–10.5)
WBC: 13.5 10*3/uL — AB (ref 4.0–10.5)

## 2013-07-20 LAB — URINALYSIS, ROUTINE W REFLEX MICROSCOPIC
BILIRUBIN URINE: NEGATIVE
Glucose, UA: NEGATIVE mg/dL
KETONES UR: NEGATIVE mg/dL
NITRITE: NEGATIVE
Protein, ur: NEGATIVE mg/dL
SPECIFIC GRAVITY, URINE: 1.013 (ref 1.005–1.030)
UROBILINOGEN UA: 0.2 mg/dL (ref 0.0–1.0)
pH: 5 (ref 5.0–8.0)

## 2013-07-20 LAB — GLUCOSE, CAPILLARY
GLUCOSE-CAPILLARY: 206 mg/dL — AB (ref 70–99)
Glucose-Capillary: 149 mg/dL — ABNORMAL HIGH (ref 70–99)
Glucose-Capillary: 162 mg/dL — ABNORMAL HIGH (ref 70–99)
Glucose-Capillary: 137 mg/dL — ABNORMAL HIGH (ref 70–99)

## 2013-07-20 MED ORDER — METFORMIN HCL 500 MG PO TABS
500.0000 mg | ORAL_TABLET | Freq: Two times a day (BID) | ORAL | Status: DC
  Administered 2013-07-20 – 2013-07-21 (×3): 500 mg via ORAL
  Filled 2013-07-20 (×5): qty 1

## 2013-07-20 MED ORDER — OXYCODONE-ACETAMINOPHEN 5-325 MG PO TABS: 1.0000 | ORAL_TABLET | ORAL | Status: DC | PRN

## 2013-07-20 MED ORDER — SODIUM CHLORIDE 0.9 % IV SOLN
Freq: Once | INTRAVENOUS | Status: AC
  Administered 2013-07-20: 15:00:00 via INTRAVENOUS

## 2013-07-20 NOTE — Discharge Summary (Signed)
Physician Discharge Summary  Patient ID: Michelle Macdonald MRN: [id number] DOB/AGE: Oct 06, 1935 78 y.o.  Admit date: 07/19/2013 Discharge date: 07/20/2013  Admission Diagnoses:  Active Problems:   Endometrial cancer   Malignant neoplasm of corpus uteri, except isthmus  Discharge Diagnoses:  Active Problems:   Endometrial cancer   Malignant neoplasm of corpus uteri, except isthmus   Discharged Condition: good  Hospital Course: On 07/19/2013, the patient underwent the following: Procedure(s): ROBOTIC ASSISTED TOTAL HYSTERECTOMY WITH BILATERAL SALPINGO OOPHORECTOMY and lymph node dissecton bilateral.   The postoperative course was uneventful.  Her CBGs were in range.  She was discharged to home on postoperative day 1 tolerating a regular diet.  Consults: None  Significant Diagnostic Studies: none  Treatments: surgery: see above  Discharge Exam: Blood pressure 108/68, pulse 80, temperature 97.7 F (36.5 C), temperature source Oral, resp. rate 16, height 5\' 1"  (1.549 m), weight 76.658 kg (169 lb), SpO2 97.00%. General appearance: alert Resp: clear to auscultation bilaterally Cardio: regular rate and rhythm, S1, S2 normal, no murmur, click, rub or gallop GI: soft, non-tender; bowel sounds normal; no masses,  no organomegaly Pelvic: PV loss: none I: C/D/I Disposition: 01-Home or Self Care  Discharge Instructions   Call MD for:  extreme fatigue    Complete by:  As directed      Call MD for:  persistant dizziness or light-headedness    Complete by:  As directed      Call MD for:  persistant nausea and vomiting    Complete by:  As directed      Call MD for:  redness, tenderness, or signs of infection (pain, swelling, redness, odor or green/yellow discharge around incision site)    Complete by:  As directed      Call MD for:  severe uncontrolled pain    Complete by:  As directed      Call MD for:  temperature >100.4    Complete by:  As directed      Diet - low sodium heart  healthy    Complete by:  As directed      Diet Carb Modified    Complete by:  As directed      Discharge wound care:    Complete by:  As directed   Keep clean and dry     Driving Restrictions    Complete by:  As directed   No driving while taking narcotics     Increase activity slowly    Complete by:  As directed      Lifting restrictions    Complete by:  As directed   No lifting > 5 lbs for 6 weeks     May shower / Bathe    Complete by:  As directed   No tub baths for 6 weeks     May walk up steps    Complete by:  As directed      Sexual Activity Restrictions    Complete by:  As directed   No intercourse for 8 weeks            Medication List         amLODipine 2.5 MG tablet  Commonly known as:  NORVASC  Take 2.5 mg by mouth every evening.     aspirin 81 MG tablet  Take 81 mg by mouth daily.     atorvastatin 20 MG tablet  Commonly known as:  LIPITOR  Take 20 mg by mouth at bedtime.     hydroxypropyl  methylcellulose 2.5 % ophthalmic solution  Commonly known as:  ISOPTO TEARS  Place 1 drop into the left eye. As needed     metFORMIN 500 MG tablet  Commonly known as:  GLUCOPHAGE  Take 500 mg by mouth 2 (two) times daily with a meal.     multivitamin with minerals Tabs tablet  Take 1 tablet by mouth daily.     oxyCODONE-acetaminophen 5-325 MG per tablet  Commonly known as:  PERCOCET/ROXICET  Take 1-2 tablets by mouth every 4 (four) hours as needed for severe pain (moderate to severe pain (when tolerating fluids)).     SYSTANE OP  Place 1 drop into both eyes at bedtime.     thioridazine 25 MG tablet  Commonly known as:  MELLARIL  Take 75 mg by mouth at bedtime.           Follow-up Information   Schedule an appointment as soon as possible for a visit with [name], MD.   Specialty:  Obstetrics and Gynecology   Contact information:   [place] [address] [phone]       Signed: Agnes Lawrence 07/20/2013, 9:01  AM

## 2013-07-20 NOTE — Discharge Instructions (Signed)
07/20/2013    Activity: 1. Be up and out of the bed during the day.  Take a nap if needed.  You may walk up steps but be careful and use the hand rail.  Stair climbing will tire you more than you think, you may need to stop part way and rest.   2. No lifting or straining for 6 weeks.  3. Do Not drive if you are taking narcotic pain medicine.  4. Shower daily.  Use soap and water on your incision and pat dry; don't rub.   5. No sexual activity and nothing in the vagina for 8 weeks.  Diet: 1. Low sodium Heart Healthy Diet is recommended.  2. It is safe to use a laxative if you have difficulty moving your bowels.   Wound Care: 1. Keep clean and dry.  Shower daily.  Reasons to call the Doctor:   Fever - Oral temperature greater than 100.4 degrees Fahrenheit  Foul-smelling vaginal discharge  Difficulty urinating  Nausea and vomiting  Increased pain at the site of the incision that is unrelieved with pain medicine.  Difficulty breathing with or without chest pain  New calf pain especially if only on one side  Sudden, continuing increased vaginal bleeding with or without clots.    Contacts: For questions or concerns you should contact:  Dr. Lahoma Crocker at [phone]  Dr. [name] at [place]  Robotic Assisted  Hysterectomy Care After Refer to this sheet in the next few weeks. These instructions provide you with information on caring for yourself after your procedure. Your caregiver may also give you more specific instructions. Your treatment has been planned according to current medical practices, but problems sometimes occur. Call your caregiver if you have any problems or questions after your procedure. HOME CARE INSTRUCTIONS Healing will take time. You may have discomfort, tenderness, swelling, and bruising at the surgical site for a couple of weeks. This is normal and will get better as time goes on.  Only take over-the-counter or  prescription medicines for pain, discomfort, or fever as directed by your caregiver.   Do not take aspirin. It can cause bleeding.   Do not drive when taking pain medicine.   Follow your caregiver's advice regarding diet, exercise, lifting, driving, and general activities.   Resume your usual diet as directed and allowed.   Get plenty of rest and sleep.   Do not douche, use tampons, or have sexual intercourse for at least 6 weeks, or until your caregiver gives you permission.   Change your bandages (dressings) as directed by your caregiver.   Monitor your temperature and notify your caregiver of a fever.   Take showers instead of baths for 2 to 3 weeks.   Do not drink alcohol until your caregiver gives you permission.   If you develop constipation, you may take a mild laxative with your caregiver's permission. Bran foods may help with constipation problems. Drinking enough fluids to keep your urine clear or pale yellow may help as well.   Try to have someone home with you for 1 or 2 weeks to help around the house.   Keep all your follow-up appointments as directed by your caregiver.  SEEK MEDICIAL CARE IF:   You have swelling, redness, or increasing pain in the wound.   You have pus coming from the wound.   You notice a bad smell coming from the wound or bandage (dressing).   You have swelling, redness, or pain from the intravenous (  IV) site.   Your wound breaks open.   You feel dizzy or lightheaded.   You have pain or bleeding when you urinate.   You have persistent diarrhea.   You have persistent nausea and vomiting.   You have abnormal vaginal discharge.   You have a rash.   You have any type of abnormal reaction or develop an allergy to your medicine.   You have poor pain control with your prescribed medicine.  SEEK IMMEDIATE MEDICIAL CARE IF:   You have a fever.   You have severe abdominal pain.   You have chest pain.   You have shortness of breath.     You faint.   You have pain, swelling, or redness of your leg.   You have heavy vaginal bleeding with blood clots.  MAKE SURE YOU:  Understand these instructions.   Will watch your condition.   Will get help right away if you are not doing well or get worse.  Document Released: 01/16/2011 Document Reviewed: 01/13/2011 [facility] Patient Information 2012 [place].

## 2013-07-21 LAB — BASIC METABOLIC PANEL
BUN: 11 mg/dL (ref 6–23)
CO2: 27 mEq/L (ref 19–32)
CREATININE: 1 mg/dL (ref 0.50–1.10)
Calcium: 9.1 mg/dL (ref 8.4–10.5)
Chloride: 103 mEq/L (ref 96–112)
GFR calc Af Amer: 62 mL/min — ABNORMAL LOW (ref >90)
GFR, EST NON AFRICAN AMERICAN: 54 mL/min — AB (ref >90)
Glucose, Bld: 144 mg/dL — ABNORMAL HIGH (ref 70–99)
Potassium: 3.8 mEq/L (ref 3.7–5.3)
Sodium: 142 mEq/L (ref 137–147)

## 2013-07-21 LAB — CBC
HCT: 35.3 % — ABNORMAL LOW (ref 36.0–46.0)
Hemoglobin: 11.4 g/dL — ABNORMAL LOW (ref 12.0–15.0)
MCH: 30.3 pg (ref 26.0–34.0)
MCHC: 32.3 g/dL (ref 30.0–36.0)
MCV: 93.9 fL (ref 78.0–100.0)
Platelets: 198 10*3/uL (ref 150–400)
RBC: 3.76 MIL/uL — ABNORMAL LOW (ref 3.87–5.11)
RDW: 13.4 % (ref 11.5–15.5)
WBC: 10.8 10*3/uL — ABNORMAL HIGH (ref 4.0–10.5)

## 2013-07-21 LAB — GLUCOSE, CAPILLARY: GLUCOSE-CAPILLARY: 161 mg/dL — AB (ref 70–99)

## 2013-07-21 NOTE — Progress Notes (Signed)
Patient ID: Michelle Macdonald, female   DOB: [birth year], 78 y.o.   MRN: [id number] 2 Days Post-Op Procedure(s) (LRB): ROBOTIC ASSISTED TOTAL HYSTERECTOMY WITH BILATERAL SALPINGO OOPHORECTOMY and lymph node dissecton bilateral (Bilateral)  Subjective: Patient reports no complaints   Objective: Vital signs in last 24 hours: Temp:  [97.7 F (36.5 C)-98.8 F (37.1 C)] 98.8 F (37.1 C) (06/11 0600) Pulse Rate:  [78-84] 78 (06/11 0600) Resp:  [16] 16 (06/11 0600) BP: (99-115)/(65-68) 101/65 mmHg (06/11 0600) SpO2:  [93 %-97 %] 97 % (06/11 0600) Last BM Date: 07/19/13  Intake/Output from previous day: 06/10 0701 - 06/11 0700 In: 2955 [P.O.:2040; I.V.:915] Out: 2825 [Urine:2825]  Physical Examination: General: alert Resp: clear to auscultation bilaterally Cardio: regular rate and rhythm, S1, S2 normal, no murmur, click, rub or gallop GI: soft, non-tender; bowel sounds normal; no masses,  no organomegaly Extremities: extremities normal, atraumatic, no cyanosis or edema and Homans sign is negative, no sign of DVT Vaginal Bleeding: none I: C/D/I Labs: WBC/Hgb/Hct/Plts:  10.8/11.4/35.3/198 (06/11 0441) BUN/Cr/glu/ALT/AST/amyl/lip:  11/1.00/--/--/--/--/-- (06/11 0441) CBG (last 3)   Recent Labs  07/20/13 1638 07/20/13 2136 07/21/13 0728  GLUCAP 162* 137* 161*     Assessment:  78 y.o. s/p Procedure(s): ROBOTIC ASSISTED TOTAL HYSTERECTOMY WITH BILATERAL SALPINGO OOPHORECTOMY and lymph node dissecton bilateral: stable  Urinary retention Pain:  Pain is well-controlled on oral medications.  CV: Hypertension:  controlled. Current treatment:  amlodipine (Norvase).  GI:  Tolerating po: Yes     Endo: Diabetes mellitus Type II, under fair control.Marland Kitchen    Prophylaxis: intermittent pneumatic compression boots.  Plan: D/C foly catheter, voiding trial Anticipate D/C home later today  LOS: 2 days    JACKSON-MOORE,Limmie Schoenberg A 07/21/2013, 9:24 AM

## 2013-07-21 NOTE — Discharge Summary (Signed)
Physician Discharge Summary  Patient ID: Michelle Macdonald MRN: [id number] DOB/AGE: 05/10/1935 78 y.o.  Admit date: 07/19/2013 Discharge date: 07/21/2013  Admission Diagnoses:  Active Problems:   Endometrial cancer   Malignant neoplasm of corpus uteri, except isthmus  Discharge Diagnoses:  Active Problems:   Endometrial cancer   Malignant neoplasm of corpus uteri, except isthmus   Discharged Condition: good  Hospital Course: On 07/19/2013, the patient underwent the following: Procedure(s): ROBOTIC ASSISTED TOTAL HYSTERECTOMY WITH BILATERAL SALPINGO OOPHORECTOMY and lymph node dissecton bilateral.   The postoperative course was remarkable for urinary retention that resolved after the foley catheter was replaced.  She was voiding without difficulty prior to discharge.  Her CBGs were in range.  She was discharged to home on postoperative day 2 tolerating a regular diet.  Consults: None  Significant Diagnostic Studies: none  Treatments: surgery: see above  Discharge Exam: Blood pressure 101/65, pulse 78, temperature 98.8 F (37.1 C), temperature source Oral, resp. rate 16, height 5\' 1"  (1.549 m), weight 76.658 kg (169 lb), SpO2 97.00%. General appearance: alert Resp: clear to auscultation bilaterally Cardio: regular rate and rhythm, S1, S2 normal, no murmur, click, rub or gallop GI: soft, non-tender; bowel sounds normal; no masses,  no organomegaly Pelvic: PV loss: none I: C/D/I Disposition: 01-Home or Self Care      Discharge Instructions   Call MD for:  extreme fatigue    Complete by:  As directed      Call MD for:  extreme fatigue    Complete by:  As directed      Call MD for:  persistant dizziness or light-headedness    Complete by:  As directed      Call MD for:  persistant dizziness or light-headedness    Complete by:  As directed      Call MD for:  persistant nausea and vomiting    Complete by:  As directed      Call MD for:  persistant nausea and vomiting     Complete by:  As directed      Call MD for:  redness, tenderness, or signs of infection (pain, swelling, redness, odor or green/yellow discharge around incision site)    Complete by:  As directed      Call MD for:  redness, tenderness, or signs of infection (pain, swelling, redness, odor or green/yellow discharge around incision site)    Complete by:  As directed      Call MD for:  severe uncontrolled pain    Complete by:  As directed      Call MD for:  severe uncontrolled pain    Complete by:  As directed      Call MD for:  temperature >100.4    Complete by:  As directed      Call MD for:  temperature >100.4    Complete by:  As directed      Diet - low sodium heart healthy    Complete by:  As directed      Diet Carb Modified    Complete by:  As directed      Discharge wound care:    Complete by:  As directed   Keep clean and dry     Discharge wound care:    Complete by:  As directed   Keep clean and dry     Driving Restrictions    Complete by:  As directed   No driving while taking narcotics     Driving Restrictions  Complete by:  As directed   No driving while taking narcotics     Increase activity slowly    Complete by:  As directed      Increase activity slowly    Complete by:  As directed      Lifting restrictions    Complete by:  As directed   No lifting > 5 lbs for 6 weeks     Lifting restrictions    Complete by:  As directed   No lifting > 5 lbs for 6 weeks     May shower / Bathe    Complete by:  As directed   No tub baths for 6 weeks     May shower / Bathe    Complete by:  As directed   No tub baths for 6 weeks     May walk up steps    Complete by:  As directed      May walk up steps    Complete by:  As directed      Sexual Activity Restrictions    Complete by:  As directed   No intercourse for 8 weeks     Sexual Activity Restrictions    Complete by:  As directed   No intercourse for  8 weeks            Medication List         amLODipine 2.5  MG tablet  Commonly known as:  NORVASC  Take 2.5 mg by mouth every evening.     aspirin 81 MG tablet  Take 81 mg by mouth daily.     atorvastatin 20 MG tablet  Commonly known as:  LIPITOR  Take 20 mg by mouth at bedtime.     hydroxypropyl methylcellulose 2.5 % ophthalmic solution  Commonly known as:  ISOPTO TEARS  Place 1 drop into the left eye. As needed     metFORMIN 500 MG tablet  Commonly known as:  GLUCOPHAGE  Take 500 mg by mouth 2 (two) times daily with a meal.     multivitamin with minerals Tabs tablet  Take 1 tablet by mouth daily.     oxyCODONE-acetaminophen 5-325 MG per tablet  Commonly known as:  PERCOCET/ROXICET  Take 1-2 tablets by mouth every 4 (four) hours as needed for severe pain (moderate to severe pain (when tolerating fluids)).     SYSTANE OP  Place 1 drop into both eyes at bedtime.     thioridazine 25 MG tablet  Commonly known as:  MELLARIL  Take 75 mg by mouth at bedtime.       Follow-up Information   Schedule an appointment as soon as possible for a visit with [name], MD.   Specialty:  Obstetrics and Gynecology   Contact information:   [place] [address] [phone]       Signed: Agnes Lawrence 07/21/2013, 11:26 AM

## 2013-07-22 ENCOUNTER — Telehealth: Payer: Self-pay | Admitting: *Deleted

## 2013-07-22 NOTE — Telephone Encounter (Signed)
Notified pt of surgical path results, no additional treatment needed.

## 2013-08-30 ENCOUNTER — Telehealth: Payer: Self-pay | Admitting: Gynecologic Oncology

## 2013-08-30 NOTE — Telephone Encounter (Signed)
Office Visit:  [place] 78 y.o. female  Chief Complaint:  Post op check, endometrial cancer   Assessment : Stage IA grade 3 endometrial carcinoma no myoinvasion, no LVSI staged 07/19/2013   Plan: No adjuvant therapy indicated. Follow-up with Gyn Onc in 6 months Follow-up with [name] in 12 months Annual pap with [name] on the s/s of recurrent disease  HPI: 78 year old who went through menopause in her early 49s and had no bleeding until approximately a month ago when she had bleeding for 2 weeks. The bleeding has stopped. Patient saw [name] on 06/09/2013. An endometrial biopsy was obtained revealing a grade 3 endometrial adenocarcinoma.  Patient denies any other pelvic symptoms including pain or pressure. She has no past gynecologic history. She has one cousin who had endometrial carcinoma. She denies any family history of colon cancer..  On 07/19/2013 she underwent RATLH BSO BPLND PALNS. Path ONCOLOGY TABLE-UTERUS, CARCINOMA Specimen: Uterus, cervix, bilateral ovaries and fallopian tubes. Procedure: Total hysterectomy and bilateral salpingo-oophorectomy. Lymph node sampling performed: Yes Specimen integrity: Intact. Maximum tumor size: 0.5 cm. Histologic type: Endometrioid carcinoma. Grade: FIGO Grade II Myometrial invasion: 0 cm where myometrium is 2.5 cm in thickness Cervical stromal involvement: No Extent of involvement of other organs: No Lymph - vascular invasion: Not identified. Peritoneal washings: N/A Lymph nodes: # examined 0 ; # positive 22 Pelvic lymph nodes: 0 involved of 17 lymph nodes. Para-aortic lymph nodes: 0 involved of 5 lymph nodes.   Past Medical History  Diagnosis Date  . Hypertension   . Diabetes mellitus   . Depression   . Arthritis     fingers   . Cancer     endometrial cancer     Past Surgical History  Procedure Laterality Date  . Robotic assisted total hysterectomy with bilateral salpingo  oopherectomy Bilateral 07/19/2013    Procedure: ROBOTIC ASSISTED TOTAL HYSTERECTOMY WITH BILATERAL SALPINGO OOPHORECTOMY and lymph node dissecton bilateral;  Surgeon: [name], MD;  Location: [facility];  Service: Gynecology;  Laterality: Bilateral;     History   Social History  . Marital Status: Widowed    Spouse Name: N/A    Number of Children: N/A  . Years of Education: N/A   Occupational History  . Not on file.   Social History Main Topics  . Smoking status: Never Smoker   . Smokeless tobacco: Never Used  . Alcohol Use: No  . Drug Use: No  . Sexual Activity: No   Other Topics Concern  . Not on file   Social History Narrative  . No narrative on file    Family History  Problem Relation Age of Onset  . Heart disease Mother   . Heart disease Sister     Review of Systems:10 point review of systems is negative except as noted in interval history.   Vitals: Blood pressure 166/80, pulse 90, temperature 98.3 F (36.8 C), temperature source Oral, resp. rate 18, height 5\' 1"  (1.549 m), weight 169 lb 6.4 oz (76.839 kg).  Physical Exam: General : The patient is a healthy woman in no acute distress.  HEENT: normocephalic, extraoccular movements normal; neck is supple without thyromegally  Lynphnodes: Supraclavicular and inguinal nodes not enlarged  Abdomen: Soft, non-tender, no ascites, no organomegally, no masses, no hernias  Pelvic:  Lower extremities: No edema or varicosities. Normal range of motion

## 2013-09-01 ENCOUNTER — Ambulatory Visit: Payer: Medicare Other | Attending: Gynecologic Oncology | Admitting: Gynecologic Oncology

## 2013-09-01 ENCOUNTER — Ambulatory Visit: Payer: Medicare Other | Admitting: Gynecologic Oncology

## 2013-09-01 ENCOUNTER — Encounter: Payer: Self-pay | Admitting: Gynecologic Oncology

## 2013-09-01 VITALS — BP 133/77 | HR 73 | Temp 98.5°F | Resp 16

## 2013-09-01 DIAGNOSIS — C541 Malignant neoplasm of endometrium: Secondary | ICD-10-CM

## 2013-09-01 DIAGNOSIS — F3289 Other specified depressive episodes: Secondary | ICD-10-CM | POA: Diagnosis not present

## 2013-09-01 DIAGNOSIS — E119 Type 2 diabetes mellitus without complications: Secondary | ICD-10-CM | POA: Diagnosis not present

## 2013-09-01 DIAGNOSIS — I10 Essential (primary) hypertension: Secondary | ICD-10-CM | POA: Insufficient documentation

## 2013-09-01 DIAGNOSIS — F329 Major depressive disorder, single episode, unspecified: Secondary | ICD-10-CM | POA: Insufficient documentation

## 2013-09-01 DIAGNOSIS — C549 Malignant neoplasm of corpus uteri, unspecified: Secondary | ICD-10-CM | POA: Diagnosis present

## 2013-09-01 DIAGNOSIS — Z09 Encounter for follow-up examination after completed treatment for conditions other than malignant neoplasm: Secondary | ICD-10-CM

## 2013-09-01 DIAGNOSIS — Z90710 Acquired absence of both cervix and uterus: Secondary | ICD-10-CM | POA: Diagnosis not present

## 2013-09-01 NOTE — Progress Notes (Signed)
Office Visit:  Michelle Macdonald 78 y.o. female  Chief Complaint:  Post op check, endometrial cancer   Assessment : Stage IA grade 3 endometrial carcinoma no myoinvasion, no LVSI staged 07/19/2013   Plan: No adjuvant therapy indicated. Follow-up with Gyn Onc in 6 months Follow-up with [name] in 12 months Annual pap with [name] on the s/s of recurrent disease  HPI: 78 year old who went through menopause in her early 17s and had no bleeding until approximately a month ago when she had bleeding for 2 weeks. The bleeding has stopped. Patient saw [name] on 06/09/2013. An endometrial biopsy was obtained revealing a grade 3 endometrial adenocarcinoma.  Patient denies any other pelvic symptoms including pain or pressure. She has no past gynecologic history. She has one cousin who had endometrial carcinoma. She denies any family history of colon cancer..  On 07/19/2013 she underwent RATLH BSO BPLND PALNS. Path ONCOLOGY TABLE-UTERUS, CARCINOMA Specimen: Uterus, cervix, bilateral ovaries and fallopian tubes. Procedure: Total hysterectomy and bilateral salpingo-oophorectomy. Lymph node sampling performed: Yes Specimen integrity: Intact. Maximum tumor size: 0.5 cm. Histologic type: Endometrioid carcinoma. Grade: FIGO Grade II Myometrial invasion: 0 cm where myometrium is 2.5 cm in thickness Cervical stromal involvement: No Extent of involvement of other organs: No Lymph - vascular invasion: Not identified. Peritoneal washings: N/A Lymph nodes: # examined 0 ; # positive 22 Pelvic lymph nodes: 0 involved of 17 lymph nodes. Para-aortic lymph nodes: 0 involved of 5 lymph nodes.  No visual changes, no n/v reports constipation, no incontinence, no vaginal bleeding  No flank pain no bruising.    Past Medical History  Diagnosis Date  . Hypertension   . Diabetes mellitus   . Depression   . Arthritis     fingers   . Cancer     endometrial cancer      Past Surgical History  Procedure Laterality Date  . Robotic assisted total hysterectomy with bilateral salpingo oopherectomy Bilateral 07/19/2013    Procedure: ROBOTIC ASSISTED TOTAL HYSTERECTOMY WITH BILATERAL SALPINGO OOPHORECTOMY and lymph node dissecton bilateral;  Surgeon: [name], MD;  Location: [facility];  Service: Gynecology;  Laterality: Bilateral;     History   Social History  . Marital Status: Widowed    Spouse Name: N/A    Number of Children: N/A  . Years of Education: N/A   Occupational History  . Not on file.   Social History Main Topics  . Smoking status: Never Smoker   . Smokeless tobacco: Never Used  . Alcohol Use: No  . Drug Use: No  . Sexual Activity: No   Other Topics Concern  . Not on file   Social History Narrative  . No narrative on file    Family History  Problem Relation Age of Onset  . Heart disease Mother   . Heart disease Sister     Review of Systems:No visual changes, no n/v reports constipation, no incontinence, no vaginal bleeding  No flank pain no bruising. Otherwise 10 point review of systems is negative   Vitals: Blood pressure 166/80, pulse 90, temperature 98.3 F (36.8 C), temperature source Oral, resp. rate 18, height 5\' 1"  (1.549 m), weight 169 lb 6.4 oz (76.839 kg).  Physical Exam: General : The patient is a healthy woman in no acute distress.  HEENT: normocephalic, extraoccular movements normal;  Lynphnodes: Supraclavicular and inguinal nodes not enlarged  Abdomen: Soft, non-tender, no ascites, no organomegally, no masses, no hernias Port sites closed  without tenderness,mass or hernia Pelvic: Nl EGBUS, grade 1 cystocele, cuff intact.  No vaginal bleeding or discharge Lower extremities: No edema or varicosities. Normal range of motion

## 2013-09-01 NOTE — Patient Instructions (Signed)
Stage IA endometrial cancer  Follow-up with Gyn Onc in 6 months Follow-up with [name] in 12 months Annual pap with [name]     Thank you very much Michelle Macdonald for allowing me to provide care for you today.  I appreciate your confidence in choosing our Gynecologic Oncology team.  If you have any questions about your visit today please call our office and we will get back to you as soon as possible.  Please consider using the website Medlineplus.gov as an Geneticist, molecular.   Francetta Found. Dillard Pascal MD., PhD Gynecologic Oncology

## 2013-10-04 ENCOUNTER — Encounter: Payer: Self-pay | Admitting: General Practice

## 2013-10-13 ENCOUNTER — Encounter (HOSPITAL_COMMUNITY): Payer: Self-pay | Admitting: Emergency Medicine

## 2013-10-13 ENCOUNTER — Emergency Department (HOSPITAL_COMMUNITY): Payer: Medicare Other

## 2013-10-13 ENCOUNTER — Emergency Department (HOSPITAL_COMMUNITY)
Admission: EM | Admit: 2013-10-13 | Discharge: 2013-10-13 | Disposition: A | Discharge status: Home / Self Care | Payer: Medicare Other | Attending: Emergency Medicine | Admitting: Emergency Medicine

## 2013-10-13 DIAGNOSIS — E119 Type 2 diabetes mellitus without complications: Secondary | ICD-10-CM | POA: Diagnosis not present

## 2013-10-13 DIAGNOSIS — Z8542 Personal history of malignant neoplasm of other parts of uterus: Secondary | ICD-10-CM | POA: Diagnosis not present

## 2013-10-13 DIAGNOSIS — M79672 Pain in left foot: Secondary | ICD-10-CM

## 2013-10-13 DIAGNOSIS — M129 Arthropathy, unspecified: Secondary | ICD-10-CM | POA: Diagnosis not present

## 2013-10-13 DIAGNOSIS — M79609 Pain in unspecified limb: Secondary | ICD-10-CM | POA: Diagnosis present

## 2013-10-13 DIAGNOSIS — Z8659 Personal history of other mental and behavioral disorders: Secondary | ICD-10-CM | POA: Diagnosis not present

## 2013-10-13 DIAGNOSIS — I10 Essential (primary) hypertension: Secondary | ICD-10-CM | POA: Insufficient documentation

## 2013-10-13 DIAGNOSIS — M109 Gout, unspecified: Secondary | ICD-10-CM | POA: Diagnosis not present

## 2013-10-13 DIAGNOSIS — Z79899 Other long term (current) drug therapy: Secondary | ICD-10-CM | POA: Diagnosis not present

## 2013-10-13 MED ORDER — ACETAMINOPHEN 325 MG PO TABS: 325.0000 mg | ORAL_TABLET | Freq: Four times a day (QID) | ORAL | Status: DC | PRN

## 2013-10-13 MED ORDER — COLCHICINE 0.6 MG PO TABS
1.2000 mg | ORAL_TABLET | Freq: Once | ORAL | Status: AC
  Administered 2013-10-13: 1.2 mg via ORAL
  Filled 2013-10-13: qty 2

## 2013-10-13 NOTE — ED Notes (Signed)
Pt denies trauma or injury. Reports left foot pain and swelling started yesterday. Pain 9/10 with ambulation. Some swelling and redness noted to pts top of left foot.

## 2013-10-13 NOTE — ED Provider Notes (Signed)
78 year old female, presents with pain on the dorsum of the left foot, mild erythema but no warmth, no pain with ankle range of motion, no pain at the base of the great toe. Otherwise the patient appears clinically well without any distress, suspect early gouty arthritis, gout septic arthritis, colchicine, home with supportive medications and followup  Medical screening examination/treatment/procedure(s) were conducted as a shared visit with non-physician practitioner(s) and myself.  I personally evaluated the patient during the encounter.  Clinical Impression: Acute gouty arthritis of L foot      Johnna Acosta, MD 10/14/13 1109

## 2013-10-13 NOTE — ED Provider Notes (Signed)
CSN: [id number]     Arrival date & time 10/13/13  79 History   First MD Initiated Contact with Patient 10/13/13 1646     Chief Complaint  Patient presents with  . Foot Pain     (Consider location/radiation/quality/duration/timing/severity/associated sxs/prior Treatment) The history is provided by the patient. No language interpreter was used.  Michelle Macdonald is a 78 y/o F with PMHx of HTN, DM, depression, arthritis, endometrial cancer presenting to the ED with left foot pain that has been ongoing since Wednesday. Patient reported that the pain started while she was walking. Patient reported that the pain is a constant aching sensation without radiation. Reported that the pain is localized to the top of the left foot. Reported that she has noticed some mild swelling and redness. Stated that the top of the foot is tender to the touch. Denied history of gout. Denied falls, injury, weakness, numbness, tingling, loss of sensation, chills, fever, hot to the touch. PCP Dr. Warren Danes  Past Medical History  Diagnosis Date  . Hypertension   . Diabetes mellitus   . Depression   . Arthritis     fingers   . Cancer     endometrial cancer    Past Surgical History  Procedure Laterality Date  . Robotic assisted total hysterectomy with bilateral salpingo oopherectomy Bilateral 07/19/2013    Procedure: ROBOTIC ASSISTED TOTAL HYSTERECTOMY WITH BILATERAL SALPINGO OOPHORECTOMY and lymph node dissecton bilateral;  Surgeon: [name], MD;  Location: [facility];  Service: Gynecology;  Laterality: Bilateral;   Family History  Problem Relation Age of Onset  . Heart disease Mother   . Heart disease Sister    History  Substance Use Topics  . Smoking status: Never Smoker   . Smokeless tobacco: Never Used  . Alcohol Use: No   OB History   Grav Para Term Preterm Abortions TAB SAB Ect Mult Living   3 3 3  0 0 0 0 0 0 1     Review of Systems  Constitutional: Negative for fever and chills.  Respiratory:  Negative for chest tightness and shortness of breath.   Cardiovascular: Negative for chest pain.  Musculoskeletal: Positive for arthralgias (left foot pain ).  Neurological: Negative for weakness and numbness.      Allergies  Review of patient's allergies indicates no known allergies.  Home Medications   Prior to Admission medications   Medication Sig Start Date End Date Taking? Authorizing Provider  amLODipine (NORVASC) 2.5 MG tablet Take 2.5 mg by mouth daily.    Yes Historical Provider, MD  atorvastatin (LIPITOR) 20 MG tablet Take 20 mg by mouth at bedtime.    Yes Historical Provider, MD  metFORMIN (GLUCOPHAGE) 500 MG tablet Take 500 mg by mouth 2 (two) times daily with a meal.   Yes Historical Provider, MD  Multiple Vitamin (MULTIVITAMIN WITH MINERALS) TABS tablet Take 1 tablet by mouth daily.   Yes Historical Provider, MD  Polyethyl Glycol-Propyl Glycol (SYSTANE OP) Place 1 drop into both eyes at bedtime.   Yes Historical Provider, MD  thioridazine (MELLARIL) 25 MG tablet Take 75 mg by mouth at bedtime.    Yes Historical Provider, MD  acetaminophen (TYLENOL) 325 MG tablet Take 1 tablet (325 mg total) by mouth every 6 (six) hours as needed. 10/13/13   Maisa Bedingfield, PA-C   BP 139/74  Pulse 81  Temp(Src) 97.9 F (36.6 C) (Oral)  Resp 16  SpO2 96% Physical Exam  Nursing note and vitals reviewed. Constitutional: She is oriented  to person, place, and time. She appears well-developed and well-nourished. No distress.  HENT:  Head: Normocephalic and atraumatic.  Eyes: Conjunctivae and EOM are normal. Right eye exhibits no discharge. Left eye exhibits no discharge.  Neck: Normal range of motion. Neck supple.  Cardiovascular: Normal rate, regular rhythm and normal heart sounds.  Exam reveals no friction rub.   No murmur heard. Pulses:      Radial pulses are 2+ on the right side, and 2+ on the left side.       Dorsalis pedis pulses are 2+ on the right side, and 2+ on the left side.         Posterior tibial pulses are 2+ on the right side, and 2+ on the left side.  Cap refill < 3 seconds Negative swelling or pitting edema noted to the lower extremities bilaterally   Pulmonary/Chest: Effort normal and breath sounds normal. No respiratory distress. She has no wheezes. She has no rales.  Musculoskeletal: She exhibits edema and tenderness.       Feet:  Mild swelling and erythema noted to the dorsal aspect of the left foot in the center with pain upon palpation. Mild swelling noted to the base of the left great toe with negative pain upon palpation. Negative wamrth upon palpation. Negative deformities, malalignments, ecchymosis, red streaks, signs of deformities to the left foot. Full ROM to the digits of the left foot without difficulty or ataxia. Full ROM to the left ankle, knee.   Neurological: She is alert and oriented to person, place, and time. No cranial nerve deficit. She exhibits normal muscle tone. Coordination normal.  Cranial nerves III-XII grossly intact Strength 5+/5+ to lower extremities bilaterally with resistance applied, equal distribution noted Strength intact to digits of the left foot Sensation intact with differentiation to sharp and dull touch  Gait proper, proper balance - negative sway, negative drift, negative step-offs  Skin: Skin is warm and dry. No rash noted. She is not diaphoretic. No erythema.  Psychiatric: She has a normal mood and affect. Her behavior is normal. Thought content normal.    ED Course  Procedures (including critical care time)  5:23 PM patient seen and assessed by attending physician, Dr. Hazle Nordmann. As per attending physician, recommended colchicine and pain medications for home - patient to be treated as gout patient.   Labs Review Labs Reviewed - No data to display  Imaging Review Dg Foot Complete Left  10/13/2013   CLINICAL DATA:  Foot pain.  EXAM: LEFT FOOT - COMPLETE 3+ VIEW  COMPARISON:  None.  FINDINGS: Diffuse  osteopenia degenerative change. No acute bony or joint abnormality. Degenerative change particularly prominent at the first MTP joint.  IMPRESSION: No acute abnormality.  Diffuse degenerative change.   Electronically Signed   By: Marcello Moores  Register   On: 10/13/2013 15:25     EKG Interpretation None      MDM   Final diagnoses:  Acute gout of left foot, unspecified cause  Left foot pain    Medications  colchicine tablet 1.2 mg (1.2 mg Oral Given 10/13/13 1753)   Filed Vitals:   10/13/13 1442  BP: 139/74  Pulse: 81  Temp: 97.9 F (36.6 C)  TempSrc: Oral  Resp: 16  SpO2: 96%   Plain film of left foot negative for acute osseous injury-diffuse degenerative changes noted. Negative focal neurological deficits noted. Full range of motion to the left ankle and digits of the left foot without difficulty or ataxia. Pulses palpable and strong.  Sensation intact. Area of erythema and swelling localized to dorsal aspect of the left foot with pain upon palpation-negative warmth. Doubt septic joint. Doubt signs of ischemia. Doubt cellulitis. Suspicion to be gouty arthritis. Patient seen and assessed by attending physician who agreed to plan of discharged pain medications. Patient stable, afebrile. Patient not septic appearing. Discharged patient. Referred patient to PCP and orthopedics. Discussed with patient to rest, ice, elevate. Discussed with patient to closely monitor symptoms and if symptoms are to worsen or change to report back to the ED - strict return instructions given.  Patient agreed to plan of care, understood, all questions answered.   Jamse Mead, PA-C 10/13/13 1759

## 2013-10-13 NOTE — Discharge Instructions (Signed)
Please call your doctor for a followup appointment within 24-48 hours. When you talk to your doctor please let them know that you were seen in the emergency department and have them acquire all of your records so that they can discuss the findings with you and formulate a treatment plan to fully care for your new and ongoing problems. Please call and set-up an appointment with yourprimary care provide to be seen and re-assessed Please rest and stay hydrated  Please rest, ice, elevate Please call and set-up an appointment with Orthopedics Please continue to monitor symptoms closely and if symptoms are to worsen or change (fever greater than 101, chills, sweating, nausea, vomiting, chest pain, shortness of breathe, difficulty breathing, weakness, numbness, tingling, worsening or changes to pain pattern, swelling, redness, hot to the touch, red streaks) please report back to the Emergency Department immediately.   Gout Gout is an inflammatory arthritis caused by a buildup of uric acid crystals in the joints. Uric acid is a chemical that is normally present in the blood. When the level of uric acid in the blood is too high it can form crystals that deposit in your joints and tissues. This causes joint redness, soreness, and swelling (inflammation). Repeat attacks are common. Over time, uric acid crystals can form into masses (tophi) near a joint, destroying bone and causing disfigurement. Gout is treatable and often preventable. CAUSES  The disease begins with elevated levels of uric acid in the blood. Uric acid is produced by your body when it breaks down a naturally found substance called purines. Certain foods you eat, such as meats and fish, contain high amounts of purines. Causes of an elevated uric acid level include:  Being passed down from parent to child (heredity).  Diseases that cause increased uric acid production (such as obesity, psoriasis, and certain cancers).  Excessive alcohol  use.  Diet, especially diets rich in meat and seafood.  Medicines, including certain cancer-fighting medicines (chemotherapy), water pills (diuretics), and aspirin.  Chronic kidney disease. The kidneys are no longer able to remove uric acid well.  Problems with metabolism. Conditions strongly associated with gout include:  Obesity.  High blood pressure.  High cholesterol.  Diabetes. Not everyone with elevated uric acid levels gets gout. It is not understood why some people get gout and others do not. Surgery, joint injury, and eating too much of certain foods are some of the factors that can lead to gout attacks. SYMPTOMS   An attack of gout comes on quickly. It causes intense pain with redness, swelling, and warmth in a joint.  Fever can occur.  Often, only one joint is involved. Certain joints are more commonly involved:  Base of the big toe.  Knee.  Ankle.  Wrist.  Finger. Without treatment, an attack usually goes away in a few days to weeks. Between attacks, you usually will not have symptoms, which is different from many other forms of arthritis. DIAGNOSIS  Your caregiver will suspect gout based on your symptoms and exam. In some cases, tests may be recommended. The tests may include:  Blood tests.  Urine tests.  X-rays.  Joint fluid exam. This exam requires a needle to remove fluid from the joint (arthrocentesis). Using a microscope, gout is confirmed when uric acid crystals are seen in the joint fluid. TREATMENT  There are two phases to gout treatment: treating the sudden onset (acute) attack and preventing attacks (prophylaxis).  Treatment of an Acute Attack.  Medicines are used. These include anti-inflammatory medicines or  steroid medicines.  An injection of steroid medicine into the affected joint is sometimes necessary.  The painful joint is rested. Movement can worsen the arthritis.  You may use warm or cold treatments on painful joints, depending  which works best for you.  Treatment to Prevent Attacks.  If you suffer from frequent gout attacks, your caregiver may advise preventive medicine. These medicines are started after the acute attack subsides. These medicines either help your kidneys eliminate uric acid from your body or decrease your uric acid production. You may need to stay on these medicines for a very long time.  The early phase of treatment with preventive medicine can be associated with an increase in acute gout attacks. For this reason, during the first few months of treatment, your caregiver may also advise you to take medicines usually used for acute gout treatment. Be sure you understand your caregiver's directions. Your caregiver may make several adjustments to your medicine dose before these medicines are effective.  Discuss dietary treatment with your caregiver or dietitian. Alcohol and drinks high in sugar and fructose and foods such as meat, poultry, and seafood can increase uric acid levels. Your caregiver or dietitian can advise you on drinks and foods that should be limited. HOME CARE INSTRUCTIONS   Do not take aspirin to relieve pain. This raises uric acid levels.  Only take over-the-counter or prescription medicines for pain, discomfort, or fever as directed by your caregiver.  Rest the joint as much as possible. When in bed, keep sheets and blankets off painful areas.  Keep the affected joint raised (elevated).  Apply warm or cold treatments to painful joints. Use of warm or cold treatments depends on which works best for you.  Use crutches if the painful joint is in your leg.  Drink enough fluids to keep your urine clear or pale yellow. This helps your body get rid of uric acid. Limit alcohol, sugary drinks, and fructose drinks.  Follow your dietary instructions. Pay careful attention to the amount of protein you eat. Your daily diet should emphasize fruits, vegetables, whole grains, and fat-free or  low-fat milk products. Discuss the use of coffee, vitamin C, and cherries with your caregiver or dietitian. These may be helpful in lowering uric acid levels.  Maintain a healthy body weight. SEEK MEDICAL CARE IF:   You develop diarrhea, vomiting, or any side effects from medicines.  You do not feel better in 24 hours, or you are getting worse. SEEK IMMEDIATE MEDICAL CARE IF:   Your joint becomes suddenly more tender, and you have chills or a fever. MAKE SURE YOU:   Understand these instructions.  Will watch your condition.  Will get help right away if you are not doing well or get worse. Document Released: 01/25/2000 Document Revised: 06/13/2013 Document Reviewed: 09/10/2011 [facility] Patient Information 2015 [place], [place]. This information is not intended to replace advice given to you by your health care provider. Make sure you discuss any questions you have with your health care provider.

## 2013-10-14 NOTE — ED Provider Notes (Signed)
Medical screening examination/treatment/procedure(s) were conducted as a shared visit with non-physician practitioner(s) and myself.  I personally evaluated the patient during the encounter  Please see my separate respective documentation pertaining to this patient encounter   Johnna Acosta, MD 10/14/13 1108

## 2013-12-12 ENCOUNTER — Encounter (HOSPITAL_COMMUNITY): Payer: Self-pay | Admitting: Emergency Medicine

## 2014-02-21 ENCOUNTER — Telehealth: Payer: Self-pay | Admitting: Gynecologic Oncology

## 2014-02-21 NOTE — Telephone Encounter (Signed)
Office Visit:  Michelle Macdonald 79 y.o. female  Chief Complaint:  Endometrial cancer surveillance  Assessment : Stage IA grade 3 endometrial carcinoma no myoinvasion, no LVSI staged 07/19/2013   Plan: No adjuvant therapy indicated. Follow-up with Gyn Onc in 12 months Follow-up with [name] in 6 months Annual pap with [name] on the s/s of recurrent disease  HPI: 79 y.o.  who went through menopause in her early 91s and had no bleeding until approximately a month ago when she had bleeding for 2 weeks. The bleeding has stopped. Patient saw [name] on 06/09/2013. An endometrial biopsy was obtained revealing a grade 3 endometrial adenocarcinoma.  Patient denies any other pelvic symptoms including pain or pressure. She has no past gynecologic history. She has one cousin who had endometrial carcinoma. She denies any family history of colon cancer..  On 07/19/2013 she underwent RATLH BSO BPLND PALNS. Path ONCOLOGY TABLE-UTERUS, CARCINOMA Specimen: Uterus, cervix, bilateral ovaries and fallopian tubes. Procedure: Total hysterectomy and bilateral salpingo-oophorectomy. Lymph node sampling performed: Yes Specimen integrity: Intact. Maximum tumor size: 0.5 cm. Histologic type: Endometrioid carcinoma. Grade: FIGO Grade II Myometrial invasion: 0 cm where myometrium is 2.5 cm in thickness Cervical stromal involvement: No Extent of involvement of other organs: No Lymph - vascular invasion: Not identified. Peritoneal washings: N/A Lymph nodes: # examined 0 ; # positive 22 Pelvic lymph nodes: 0 involved of 17 lymph nodes. Para-aortic lymph nodes: 0 involved of 5 lymph nodes.     Past Medical History  Diagnosis Date  . Hypertension   . Diabetes mellitus   . Depression   . Arthritis     fingers   . Cancer     endometrial cancer     Past Surgical History  Procedure Laterality Date  . Robotic assisted total hysterectomy with bilateral salpingo  oopherectomy Bilateral 07/19/2013    Procedure: ROBOTIC ASSISTED TOTAL HYSTERECTOMY WITH BILATERAL SALPINGO OOPHORECTOMY and lymph node dissecton bilateral;  Surgeon: [name], MD;  Location: [facility];  Service: Gynecology;  Laterality: Bilateral;     History   Social History  . Marital Status: Widowed    Spouse Name: N/A    Number of Children: N/A  . Years of Education: N/A   Occupational History  . Not on file.   Social History Main Topics  . Smoking status: Never Smoker   . Smokeless tobacco: Never Used  . Alcohol Use: No  . Drug Use: No  . Sexual Activity: No   Other Topics Concern  . Not on file   Social History Narrative    Family History  Problem Relation Age of Onset  . Heart disease Mother   . Heart disease Sister     Review of Systems:No visual changes, no n/v reports constipation, no incontinence, no vaginal bleeding  No flank pain no bruising. Otherwise 10 point review of systems is negative   Vitals:  Physical Exam: General : The patient is a healthy woman in no acute distress.  HEENT: normocephalic, extraoccular movements normal;  Lynphnodes: Supraclavicular and inguinal nodes not enlarged  Abdomen: Soft, non-tender, no ascites, no organomegally, no masses, no hernias Port sites closed without tenderness,mass or hernia Pelvic: Nl EGBUS, grade 1 cystocele, cuff intact.  No vaginal bleeding or discharge Lower extremities: No edema or varicosities. Normal range of motion

## 2014-02-23 ENCOUNTER — Ambulatory Visit: Payer: Medicare Other | Attending: Gynecologic Oncology | Admitting: Gynecologic Oncology

## 2014-02-23 ENCOUNTER — Encounter (HOSPITAL_COMMUNITY): Payer: Self-pay | Admitting: Gynecologic Oncology

## 2014-02-23 VITALS — BP 133/71 | HR 75 | Temp 97.9°F | Resp 18 | Ht 61.0 in | Wt 162.3 lb

## 2014-02-23 DIAGNOSIS — C541 Malignant neoplasm of endometrium: Secondary | ICD-10-CM | POA: Diagnosis present

## 2014-02-23 NOTE — Patient Instructions (Addendum)
Follow-up with [name] in 6 months Follow-up with Gyn Onc in 12 months. Please call us in October 2016 to schedule this appt.    Thank you very much Ms. [name] for allowing me to provide care for you today.  I appreciate your confidence in choosing our Gynecologic Oncology team.  If you have any questions about your visit today please call our office and we will get back to you as soon as possible.  Please consider using the website Medlineplus.gov as an Geneticist, molecular.   Francetta Found. Gohan Collister MD., PhD Gynecologic Oncology

## 2014-02-23 NOTE — Progress Notes (Signed)
Office Visit:  Michelle Macdonald 79 y.o. female  Chief Complaint:  Endometrial cancer surveillance  Assessment : Stage IA grade 3 endometrial carcinoma no myoinvasion, no LVSI staged 07/19/2013   Plan: No adjuvant therapy indicated. Follow-up with Gyn Onc in 12 months Follow-up with Michelle Macdonald in 6 months Annual pap with [name] on the s/s of recurrent disease  HPI: 79 y.o.  who went through menopause in her early 72s and had no bleeding until approximately a month ago when she had bleeding for 2 weeks. The bleeding has stopped. Patient saw Michelle Macdonald on 06/09/2013. An endometrial biopsy was obtained revealing a grade 3 endometrial adenocarcinoma.  Patient denies any other pelvic symptoms including pain or pressure. She has no past gynecologic history. She has one cousin who had endometrial carcinoma. She denies any family history of colon cancer..  On 07/19/2013 she underwent RATLH BSO BPLND PALNS. Path ONCOLOGY TABLE-UTERUS, CARCINOMA Specimen: Uterus, cervix, bilateral ovaries and fallopian tubes. Procedure: Total hysterectomy and bilateral salpingo-oophorectomy. Lymph node sampling performed: Yes Specimen integrity: Intact. Maximum tumor size: 0.5 cm. Histologic type: Endometrioid carcinoma. Grade: FIGO Grade II Myometrial invasion: 0 cm where myometrium is 2.5 cm in thickness Cervical stromal involvement: No Extent of involvement of other organs: No Lymph - vascular invasion: Not identified. Peritoneal washings: N/A Lymph nodes: # examined 0 ; # positive 22 Pelvic lymph nodes: 0 involved of 17 lymph nodes. Para-aortic lymph nodes: 0 involved of 5 lymph nodes.     Past Medical History  Diagnosis Date  . Hypertension   . Diabetes mellitus   . Depression   . Arthritis     fingers   . Cancer     endometrial cancer     Past Surgical History  Procedure Laterality Date  . Robotic assisted total hysterectomy with bilateral salpingo  oopherectomy Bilateral 07/19/2013    Procedure: ROBOTIC ASSISTED TOTAL HYSTERECTOMY WITH BILATERAL SALPINGO OOPHORECTOMY and lymph node dissecton bilateral;  Surgeon: Michelle Morning, MD;  Location: [facility];  Service: Gynecology;  Laterality: Bilateral;     History   Social History  . Marital Status: Widowed    Spouse Name: N/A    Number of Children: N/A  . Years of Education: N/A   Occupational History  . Not on file.   Social History Main Topics  . Smoking status: Never Smoker   . Smokeless tobacco: Never Used  . Alcohol Use: No  . Drug Use: No  . Sexual Activity: No   Other Topics Concern  . Not on file   Social History Narrative    Family History  Problem Relation Age of Onset  . Heart disease Mother   . Heart disease Sister     Review of Systems: No visual changes, no n/v reports constipation, no incontinence, no vaginal  Rectal or bladderbleeding  No flank pain no bruising. No swelling of extremitites, no cough, no chest pain, o bloating, good appetite. Otherwise 10 point review of systems is negative   Vitals: BP 133/71 mmHg  Pulse 75  Temp(Src) 97.9 F (36.6 C) (Oral)  Resp 18  Ht 5\' 1"  (1.549 m)  Wt 162 lb 4.8 oz (73.619 kg)  BMI 30.68 kg/m2  Physical Exam: General : The patient is a healthy woman in no acute distress. Chest:  CTA Cardiac RRR  HEENT: normocephalic, extraoccular movements normal;  Lymph nodes: Supraclavicular and inguinal nodes not enlarged Back:  No CVAT  Abdomen: Soft, non-tender, no ascites, no  organomegally, no masses, no hernias,  Port sites without tenderness,mass or hernia Pelvic: Nl EGBUS, grade 1 cystocele, cuff intact.  No vaginal bleeding or discharge Rectal:  Good tone no masses Lower extremities: No edema or varicosities. Normal range of motion

## 2014-06-17 ENCOUNTER — Encounter (HOSPITAL_COMMUNITY): Payer: Self-pay | Admitting: *Deleted

## 2014-06-17 ENCOUNTER — Emergency Department (INDEPENDENT_AMBULATORY_CARE_PROVIDER_SITE_OTHER)
Admission: EM | Admit: 2014-06-17 | Discharge: 2014-06-17 | Disposition: A | Discharge status: Home / Self Care | Payer: Medicare Other | Source: Home / Self Care

## 2014-06-17 DIAGNOSIS — J069 Acute upper respiratory infection, unspecified: Secondary | ICD-10-CM

## 2014-06-17 DIAGNOSIS — B9789 Other viral agents as the cause of diseases classified elsewhere: Secondary | ICD-10-CM

## 2014-06-17 DIAGNOSIS — J029 Acute pharyngitis, unspecified: Secondary | ICD-10-CM

## 2014-06-17 DIAGNOSIS — J028 Acute pharyngitis due to other specified organisms: Principal | ICD-10-CM

## 2014-06-17 HISTORY — DX: Hyperlipidemia, unspecified: E78.5

## 2014-06-17 LAB — POCT RAPID STREP A: STREPTOCOCCUS, GROUP A SCREEN (DIRECT): NEGATIVE

## 2014-06-17 MED ORDER — FLUTICASONE PROPIONATE 50 MCG/ACT NA SUSP: 2.0000 | Freq: Every day | NASAL | Status: DC

## 2014-06-17 MED ORDER — IPRATROPIUM BROMIDE 0.06 % NA SOLN: 2.0000 | Freq: Four times a day (QID) | NASAL | Status: DC

## 2014-06-17 NOTE — ED Provider Notes (Signed)
CSN: [id number]     Arrival date & time 06/17/14  1042 History   None    Chief Complaint  Patient presents with  . Sore Throat  . Nasal Congestion   (Consider location/radiation/quality/duration/timing/severity/associated sxs/prior Treatment) HPI  Sore throat: started 4 days ago. Associated w/ runny nose, congestion, adn occasional cough. otc cough medicine w/o benefit. Tylenol w/o benefit. Worse at night. No change in overall condition.   Diabetes: Home glucose typically in the 1 teens daily.   Denies fevers, rash, chest pain, shortness breath, palpitations, abdominal pain, nausea, vomiting, diarrhea, constipation. Past Medical History  Diagnosis Date  . Hypertension   . Diabetes mellitus   . Depression   . Arthritis     fingers   . Hyperlipidemia   . Cancer     ovarian cancer   Past Surgical History  Procedure Laterality Date  . Robotic assisted total hysterectomy with bilateral salpingo oopherectomy Bilateral 07/19/2013    Procedure: ROBOTIC ASSISTED TOTAL HYSTERECTOMY WITH BILATERAL SALPINGO OOPHORECTOMY and lymph node dissecton bilateral;  Surgeon: [name], MD;  Location: [facility];  Service: Gynecology;  Laterality: Bilateral;   Family History  Problem Relation Age of Onset  . Heart disease Mother   . Heart disease Sister    History  Substance Use Topics  . Smoking status: Never Smoker   . Smokeless tobacco: Never Used  . Alcohol Use: No   OB History    Gravida Para Term Preterm AB TAB SAB Ectopic Multiple Living   3 3 3  0 0 0 0 0 0 1     Review of Systems Per HPI with all other pertinent systems negative.   Allergies  Review of patient's allergies indicates no known allergies.  Home Medications   Prior to Admission medications   Medication Sig Start Date End Date Taking? Authorizing Provider  acetaminophen (TYLENOL) 325 MG tablet Take 1 tablet (325 mg total) by mouth every 6 (six) hours as needed. 10/13/13  Yes Marissa Sciacca, PA-C  amLODipine  (NORVASC) 2.5 MG tablet Take 2.5 mg by mouth daily.    Yes Historical Provider, MD  atorvastatin (LIPITOR) 20 MG tablet Take 20 mg by mouth at bedtime.    Yes Historical Provider, MD  metFORMIN (GLUCOPHAGE) 500 MG tablet Take 500 mg by mouth 2 (two) times daily with a meal.   Yes Historical Provider, MD  Multiple Vitamin (MULTIVITAMIN WITH MINERALS) TABS tablet Take 1 tablet by mouth daily.   Yes Historical Provider, MD  thioridazine (MELLARIL) 25 MG tablet Take 75 mg by mouth at bedtime.    Yes Historical Provider, MD  fluticasone (FLONASE) 50 MCG/ACT nasal spray Place 2 sprays into both nostrils at bedtime. 06/17/14   Waldemar Dickens, MD  ipratropium (ATROVENT) 0.06 % nasal spray Place 2 sprays into both nostrils 4 (four) times daily. 06/17/14   Waldemar Dickens, MD  Polyethyl Glycol-Propyl Glycol (SYSTANE OP) Place 1 drop into both eyes at bedtime.    Historical Provider, MD   BP 128/91 mmHg  Pulse 90  Temp(Src) 97.7 F (36.5 C) (Oral)  Resp 14  SpO2 93% Physical Exam Physical Exam  Constitutional: oriented to person, place, and time. appears well-developed and well-nourished. No distress.  HENT:  Pharyngeal injection, tonsils 0-1+ without exam. Boggy nasal turbinates, no cervical lymphadenopathy, clear rhinorrhea. Head: Normocephalic and atraumatic.  Eyes: EOMI. PERRL.  Neck: Normal range of motion.  Cardiovascular: RRR, no m/r/g, 2+ distal pulses,  Pulmonary/Chest: No stridor. Effort normal and breath sounds normal.  No respiratory distress.  Abdominal: Soft. Bowel sounds are normal. NonTTP, no distension.  Musculoskeletal: Normal range of motion. Non ttp, no effusion.  Neurological: alert and oriented to person, place, and time.  Skin: Skin is warm. No rash noted. non diaphoretic.  Psychiatric: normal mood and affect. behavior is normal. Judgment and thought content normal.   ED Course  Procedures (including critical care time) Labs Review Labs Reviewed  POCT RAPID STREP A ([place])    Imaging Review No results found.   MDM   1. Sore throat (viral)   2. URI (upper respiratory infection)    nasal Atrovent, Flonase, Zyrtec or Allegra, ibuprofen 600 mg every 6 hours, Sudafed if tolerated. Rapid strep negative. Strep culture sent.    Waldemar Dickens, MD 06/17/14 1130

## 2014-06-17 NOTE — Discharge Instructions (Signed)
You are suffering from a upper respiratory viral infection. Please use the nasal Atrovent to help drive her nasal secretions. Please use the Flonase at night before bed to help decrease the inflammation and swelling. Please also consider using 600 mg of ibuprofen every 6 hours for pain and inflammation. Is also consider using Sudafed to help decrease the pressure in your sinuses in your head. This medicine again the palpitations seem a want to skip taking this medicine. Your symptoms should get better in another 1-3 days. Please also consider using a daily allergy pill for the next few days such as Zyrtec or Allegra.

## 2014-06-17 NOTE — ED Notes (Signed)
C/O starting with sore throat 4 days ago, along with nasal congestion, post-nasal drainage, and occasional cough.  Denies fevers.  Has been taking Tyl and using Cepacol.

## 2014-06-19 LAB — CULTURE, GROUP A STREP: STREP A CULTURE: NEGATIVE

## 2014-08-11 ENCOUNTER — Other Ambulatory Visit: Payer: Self-pay | Admitting: Internal Medicine

## 2014-08-11 DIAGNOSIS — E2839 Other primary ovarian failure: Secondary | ICD-10-CM

## 2015-03-13 ENCOUNTER — Telehealth: Payer: Self-pay | Admitting: Gynecologic Oncology

## 2015-03-13 NOTE — Telephone Encounter (Signed)
Office Visit:  [place] 80 y.o. female  Chief Complaint:  Endometrial cancer surveillance  Assessment : Stage IA grade 3 endometrial carcinoma no myoinvasion, no LVSI staged 07/19/2013   Plan: No adjuvant therapy indicated. Follow-up with Gyn Onc as indicated Follow-up with [name] in 12 months Annual pap with [name] on the s/s of recurrent disease  HPI: 80 y.o.  who went through menopause in her early 11s and had no bleeding until approximately a month ago when she had bleeding for 2 weeks. The bleeding has stopped. Patient saw [name] on 06/09/2013. An endometrial biopsy was obtained revealing a grade 3 endometrial adenocarcinoma.  Patient denies any other pelvic symptoms including pain or pressure. She has no past gynecologic history. She has one cousin who had endometrial carcinoma. She denies any family history of colon cancer..  On 07/19/2013 she underwent RATLH BSO BPLND PALNS. Path ONCOLOGY TABLE-UTERUS, CARCINOMA Specimen: Uterus, cervix, bilateral ovaries and fallopian tubes. Procedure: Total hysterectomy and bilateral salpingo-oophorectomy. Lymph node sampling performed: Yes Specimen integrity: Intact. Maximum tumor size: 0.5 cm. Histologic type: Endometrioid carcinoma. Grade: FIGO Grade II Myometrial invasion: 0 cm where myometrium is 2.5 cm in thickness Cervical stromal involvement: No Extent of involvement of other organs: No Lymph - vascular invasion: Not identified. Peritoneal washings: N/A Lymph nodes: # examined 0 ; # positive 22 Pelvic lymph nodes: 0 involved of 17 lymph nodes. Para-aortic lymph nodes: 0 involved of 5 lymph nodes.   Review of Systems: No visual changes, no n/v reports constipation, no incontinence, no vaginal  Rectal or bladderbleeding  No flank pain no bruising. No swelling of extremitites, no cough, no chest pain, o bloating, good appetite. Otherwise 10 point review of systems is  negative   Past Medical History  Diagnosis Date  . Hypertension   . Diabetes mellitus   . Depression   . Arthritis     fingers   . Hyperlipidemia   . Cancer     ovarian cancer    Past Surgical History  Procedure Laterality Date  . Robotic assisted total hysterectomy with bilateral salpingo oopherectomy Bilateral 07/19/2013    Procedure: ROBOTIC ASSISTED TOTAL HYSTERECTOMY WITH BILATERAL SALPINGO OOPHORECTOMY and lymph node dissecton bilateral;  Surgeon: [name], MD;  Location: [facility];  Service: Gynecology;  Laterality: Bilateral;     Social History   Social History  . Marital Status: Widowed    Spouse Name: N/A  . Number of Children: N/A  . Years of Education: N/A   Occupational History  . Not on file.   Social History Main Topics  . Smoking status: Never Smoker   . Smokeless tobacco: Never Used  . Alcohol Use: No  . Drug Use: No  . Sexual Activity: Not on file   Other Topics Concern  . Not on file   Social History Narrative    Family History  Problem Relation Age of Onset  . Heart disease Mother   . Heart disease Sister     Vitals: BP 133/71 mmHg  Pulse 75  Temp(Src) 97.9 F (36.6 C) (Oral)  Resp 18  Ht 5\' 1"  (1.549 m)  Wt 162 lb 4.8 oz (73.619 kg)  BMI 30.68 kg/m2  Physical Exam: General : The patient is a healthy woman in no acute distress. Chest:  CTA Cardiac RRR  HEENT: normocephalic, extraoccular movements normal;  Lymph nodes: Supraclavicular and inguinal nodes not enlarged Back:  No CVAT  Abdomen: Soft, non-tender, no  ascites, no organomegally, no masses, no hernias,  Port sites without tenderness,mass or hernia Pelvic: Nl EGBUS, grade 1 cystocele, cuff intact.  No vaginal bleeding or discharge Rectal:  Good tone no masses Lower extremities: No edema or varicosities. Normal range of motion

## 2015-03-15 ENCOUNTER — Ambulatory Visit: Payer: Medicare Other | Attending: Gynecologic Oncology | Admitting: Gynecologic Oncology

## 2015-03-15 ENCOUNTER — Encounter: Payer: Self-pay | Admitting: Gynecologic Oncology

## 2015-03-15 ENCOUNTER — Other Ambulatory Visit (HOSPITAL_COMMUNITY)
Admission: RE | Admit: 2015-03-15 | Discharge: 2015-03-15 | Disposition: A | Discharge status: Home / Self Care | Payer: Medicare Other | Source: Ambulatory Visit | Attending: Gynecologic Oncology | Admitting: Gynecologic Oncology

## 2015-03-15 VITALS — BP 133/69 | HR 74 | Temp 98.5°F | Resp 18 | Ht 61.0 in | Wt 157.9 lb

## 2015-03-15 DIAGNOSIS — Z01411 Encounter for gynecological examination (general) (routine) with abnormal findings: Secondary | ICD-10-CM | POA: Insufficient documentation

## 2015-03-15 DIAGNOSIS — K469 Unspecified abdominal hernia without obstruction or gangrene: Secondary | ICD-10-CM

## 2015-03-15 DIAGNOSIS — N8110 Cystocele, unspecified: Secondary | ICD-10-CM | POA: Insufficient documentation

## 2015-03-15 DIAGNOSIS — C541 Malignant neoplasm of endometrium: Secondary | ICD-10-CM | POA: Insufficient documentation

## 2015-03-15 DIAGNOSIS — IMO0002 Reserved for concepts with insufficient information to code with codable children: Secondary | ICD-10-CM

## 2015-03-15 DIAGNOSIS — N816 Rectocele: Secondary | ICD-10-CM

## 2015-03-15 NOTE — Patient Instructions (Signed)
We will call you with the results of your pap smear from today.  Plan to follow up in six months or sooner if needed.  Please call in May or June 2017 to schedule an appt for August with Dr. Skeet Latch.  Please call for any questions or concerns.

## 2015-03-15 NOTE — Progress Notes (Signed)
Office Visit:  Sharp 80 y.o. female  Chief Complaint:  Endometrial cancer surveillance  Assessment : Stage IA grade 3 endometrial carcinoma no myoinvasion, no LVSI staged 07/19/2013   Plan: No adjuvant therapy indicated. No evidence of disease Follow-up with Gyn Onc as indicated  Pap collected today Counseled on the s/s of recurrent disease  HPI: 80 y.o.  who went through menopause in her early 33s and had no bleeding until approximately a month ago when she had bleeding for 2 weeks. The bleeding has stopped. Patient saw [name] on 06/09/2013. An endometrial biopsy was obtained revealing a grade 3 endometrial adenocarcinoma.  Patient denies any other pelvic symptoms including pain or pressure. She has no past gynecologic history. She has one cousin who had endometrial carcinoma. She denies any family history of colon cancer..  On 07/19/2013 she underwent RATLH BSO BPLND PALNS. Path ONCOLOGY TABLE-UTERUS, CARCINOMA Specimen: Uterus, cervix, bilateral ovaries and fallopian tubes. Procedure: Total hysterectomy and bilateral salpingo-oophorectomy. Lymph node sampling performed: Yes Specimen integrity: Intact. Maximum tumor size: 0.5 cm. Histologic type: Endometrioid carcinoma. Grade: FIGO Grade II Myometrial invasion: 0 cm where myometrium is 2.5 cm in thickness Cervical stromal involvement: No Extent of involvement of other organs: No Lymph - vascular invasion: Not identified. Peritoneal washings: N/A Lymph nodes: # examined 0 ; # positive 22 Pelvic lymph nodes: 0 involved of 17 lymph nodes. Para-aortic lymph nodes: 0 involved of 5 lymph nodes.   Review of Systems: No visual changes, no n/v reports constipation, no incontinence, no vaginal bleeding or discharge no rectal or bladder bleeding  No flank pain no bruising. No swelling of extremitites, no cough, no chest pain, no bloating, good appetite. Otherwise 10 point review of systems is  negative   Past Medical History  Diagnosis Date  . Hypertension   . Diabetes mellitus   . Depression   . Arthritis     fingers   . Hyperlipidemia   . Cancer [facility])     ovarian cancer    Past Surgical History  Procedure Laterality Date  . Robotic assisted total hysterectomy with bilateral salpingo oopherectomy Bilateral 07/19/2013    Procedure: ROBOTIC ASSISTED TOTAL HYSTERECTOMY WITH BILATERAL SALPINGO OOPHORECTOMY and lymph node dissecton bilateral;  Surgeon: [name], MD;  Location: [facility];  Service: Gynecology;  Laterality: Bilateral;     Social History   Social History  . Marital Status: Widowed    Spouse Name: N/A  . Number of Children: N/A  . Years of Education: N/A   Occupational History  . Not on file.   Social History Main Topics  . Smoking status: Never Smoker   . Smokeless tobacco: Never Used  . Alcohol Use: No  . Drug Use: No  . Sexual Activity: Not on file   Other Topics Concern  . Not on file   Social History Narrative    Family History  Problem Relation Age of Onset  . Heart disease Mother   . Heart disease Sister     Vitals: BP 133/69 mmHg  Pulse 74  Temp(Src) 98.5 F (36.9 C) (Oral)  Resp 18  Ht 5\' 1"  (1.549 m)  Wt 157 lb 14.4 oz (71.623 kg)  BMI 29.85 kg/m2  SpO2 100%  Physical Exam: General : The patient is a healthy woman in no acute distress. Chest:  CTA Cardiac RRR  HEENT: normocephalic, extraoccular movements normal;  Lymph nodes: Supraclavicular and inguinal nodes not enlarged Back:  No CVAT  Abdomen: Soft, non-tender, no ascites, no organomegally, no masses, no hernias,  Port sites without tenderness,mass or hernia Pelvic: Nl EGBUS, grade 2 cystocele, cuff intact. Grade 1 enterocele, grade 1 rectocele  No vaginal bleeding or discharge Rectal:  Good tone no masses Lower extremities: No edema or varicosities. Normal range of motion

## 2015-03-20 ENCOUNTER — Telehealth: Payer: Self-pay

## 2015-03-20 LAB — CYTOLOGY - PAP

## 2015-03-20 NOTE — Telephone Encounter (Signed)
Orders received from [place] to contact the patient to update with PAP results obtained on 03/15/2015 during MD follow up with Dr [name] , results  were "NEG" . Attempted to reach the patient , no answer and no answering machine , will attempt again .

## 2015-03-21 NOTE — Telephone Encounter (Signed)
Second attempt to contact the patient to update with PAP results obtained on 02/02 /2017 were "normal" , patient states understanding , denies further questions at this time.

## 2015-03-26 ENCOUNTER — Encounter: Payer: Self-pay | Admitting: Gynecologic Oncology

## 2015-03-26 ENCOUNTER — Other Ambulatory Visit: Payer: Self-pay | Admitting: Gynecologic Oncology

## 2015-03-26 NOTE — Progress Notes (Signed)
Spoke with Michelle Macdonald in Pathology about Dr. Leone Brand request for second opinion on pap smear done on 03/15/15.  Request to be faxed to [facility] at [phone].

## 2015-03-29 ENCOUNTER — Encounter: Payer: Self-pay | Admitting: Gynecologic Oncology

## 2015-03-29 NOTE — Progress Notes (Signed)
Dr. Skeet Latch had requested patient's pap to be sent to Dr. Rayburn Go at [facility] for review.  Spoke with [facility] path.  The requested info/slide went out on Feb 13.

## 2015-09-01 ENCOUNTER — Ambulatory Visit (INDEPENDENT_AMBULATORY_CARE_PROVIDER_SITE_OTHER): Payer: Medicare Other

## 2015-09-01 ENCOUNTER — Ambulatory Visit (INDEPENDENT_AMBULATORY_CARE_PROVIDER_SITE_OTHER): Payer: Medicare Other | Admitting: Family Medicine

## 2015-09-01 VITALS — BP 134/80 | HR 95 | Temp 98.9°F | Resp 18 | Ht 61.0 in | Wt 154.0 lb

## 2015-09-01 DIAGNOSIS — M25552 Pain in left hip: Secondary | ICD-10-CM | POA: Diagnosis not present

## 2015-09-01 DIAGNOSIS — M25572 Pain in left ankle and joints of left foot: Secondary | ICD-10-CM | POA: Diagnosis not present

## 2015-09-01 DIAGNOSIS — R42 Dizziness and giddiness: Secondary | ICD-10-CM

## 2015-09-01 DIAGNOSIS — M25562 Pain in left knee: Secondary | ICD-10-CM

## 2015-09-01 LAB — POCT CBC
Granulocyte percent: 54.9 %G (ref 37–80)
HCT, POC: 38.4 % (ref 37.7–47.9)
Hemoglobin: 13 g/dL (ref 12.2–16.2)
LYMPH, POC: 2 (ref 0.6–3.4)
MCH: 29.8 pg (ref 27–31.2)
MCHC: 33.9 g/dL (ref 31.8–35.4)
MCV: 87.8 fL (ref 80–97)
MID (CBC): 0.3 (ref 0–0.9)
MPV: 7.6 fL (ref 0–99.8)
POC Granulocyte: 2.9 (ref 2–6.9)
POC LYMPH PERCENT: 38.5 %L (ref 10–50)
POC MID %: 6.6 % (ref 0–12)
Platelet Count, POC: 225 10*3/uL (ref 142–424)
RBC: 4.37 M/uL (ref 4.04–5.48)
RDW, POC: 14.2 %
WBC: 5.2 10*3/uL (ref 4.6–10.2)

## 2015-09-01 LAB — GLUCOSE, POCT (MANUAL RESULT ENTRY): POC Glucose: 138 mg/dl — AB (ref 70–99)

## 2015-09-01 NOTE — Progress Notes (Signed)
By signing my name below, I, Mesha Guinyard, attest that this documentation has been prepared under the direction and in the presence of Merri Ray, MD.  Electronically Signed: Verlee Monte, Medical Scribe. 09/01/2015. 1:49 PM.  Subjective:    Patient ID: Michelle Macdonald, female    DOB: Oct 12, 1935, 80 y.o.   MRN: [id number]  HPI Chief Complaint  Patient presents with  . Leg Pain    BEHIND LEFT KNEE  . Dizziness    HPI Comments: Michelle Macdonald is a 80 y.o. female who presents to the [facility] complaining of worsening left leg pain and dizziness. Pt has multiple medical problems including DM, HTN, and schizophrenia. PCP is Philis Fendt, MD. Anemia with last hemoglobin 11.14 July 2013.   Pt mentions there's pain in the back of her left leg and left hip onset a week ago. Pt states when she walks pain radiates to her left leg and ankle. Pt was told it was arthritis from her PCP who she saw 3 days ago, and was advised to take ASA for relief. Pt states the ASA isn't working for her. Pt denies injury to her leg and hip- no falls, or twisting her leg. Pt would like to get an x-ray to check her leg. Pt's last gout attack a couple of months ago. Pt states the pain in her knee is different from her gout pain. Pt denies back pain.  Pt reports dizziness that started 3 days ago. Pt mentions she gets dizzy when she puts her head down. Dr. Jeanie Cooks gave her Meclizine for her symptoms- it helps initially, but her dizziness comes back. Pt states she was given a EKG without concerning results 2 days ago. Pt had blood work taken in the office that had nl results. Pt denies visual disturbances, trouble breathing, weakness, facial drooping, and difficulty speaking.  Patient Active Problem List   Diagnosis Date Noted  . Malignant neoplasm of corpus uteri, except isthmus ([place]) 07/19/2013  . Endometrial cancer ([place]) 06/23/2013  . Postmenopausal bleeding 06/09/2013  . DIABETES  MELLITUS, TYPE II 07/11/2006  . HYPERLIPIDEMIA 07/11/2006  . SCHIZOPHRENIA NOS, UNSPECIFIED 07/11/2006  . HYPERTENSION 07/11/2006  . DYSPEPSIA, HX OF 07/11/2006   Past Medical History  Diagnosis Date  . Hypertension   . Diabetes mellitus   . Depression   . Arthritis     fingers   . Hyperlipidemia   . Cancer [facility])     ovarian cancer   Past Surgical History  Procedure Laterality Date  . Robotic assisted total hysterectomy with bilateral salpingo oopherectomy Bilateral 07/19/2013    Procedure: ROBOTIC ASSISTED TOTAL HYSTERECTOMY WITH BILATERAL SALPINGO OOPHORECTOMY and lymph node dissecton bilateral;  Surgeon: [name], MD;  Location: [facility];  Service: Gynecology;  Laterality: Bilateral;   No Known Allergies Prior to Admission medications   Medication Sig Start Date End Date Taking? Authorizing Provider  amLODipine (NORVASC) 2.5 MG tablet Take 2.5 mg by mouth daily.    Yes Historical Provider, MD  atorvastatin (LIPITOR) 20 MG tablet Take 20 mg by mouth at bedtime.    Yes Historical Provider, MD  BAYER CONTOUR TEST test strip  12/26/14  Yes Historical Provider, MD  metFORMIN (GLUCOPHAGE) 500 MG tablet Take 500 mg by mouth 2 (two) times daily with a meal.   Yes Historical Provider, MD  MICROLET LANCETS [place]  01/19/15  Yes Historical Provider, MD  Multiple Vitamin (MULTIVITAMIN WITH MINERALS) TABS tablet Take 1 tablet by mouth daily.  Yes Historical Provider, MD  thioridazine (MELLARIL) 25 MG tablet Take 50 mg by mouth at bedtime.    Yes Historical Provider, MD  acetaminophen (TYLENOL) 325 MG tablet Take 1 tablet (325 mg total) by mouth every 6 (six) hours as needed. Patient not taking: Reported on 03/15/2015 10/13/13   Jamse Mead, PA-C   Social History   Social History  . Marital Status: Widowed    Spouse Name: N/A  . Number of Children: N/A  . Years of Education: N/A   Occupational History  . Not on file.   Social History Main Topics  . Smoking status: Never Smoker     . Smokeless tobacco: Never Used  . Alcohol Use: No  . Drug Use: No  . Sexual Activity: Not on file   Other Topics Concern  . Not on file   Social History Narrative   Review of Systems  Eyes: Negative for visual disturbance.  Respiratory: Negative for shortness of breath and wheezing.   Cardiovascular: Negative for chest pain.  Musculoskeletal: Negative for back pain.  Neurological: Positive for dizziness. Negative for facial asymmetry, speech difficulty and weakness.    Objective:  BP 134/80 mmHg  Pulse 95  Temp(Src) 98.9 F (37.2 C) (Oral)  Resp 18  Ht 5\' 1"  (1.549 m)  Wt 154 lb (69.854 kg)  BMI 29.11 kg/m2  SpO2 96%  Physical Exam  Constitutional: She appears well-developed and well-nourished. No distress.  HENT:  Head: Normocephalic and atraumatic.  Eyes: Conjunctivae are normal. Left eye exhibits no nystagmus.  Pt had a non focal exam  Neck: Neck supple.  Cardiovascular: Normal rate, regular rhythm and normal heart sounds.  Exam reveals no gallop and no friction rub.   No murmur heard. Pulmonary/Chest: Effort normal.  Musculoskeletal:  Tender along the left popliteal fossa Left calf non tender, no edema Left leg FROM Patella non tender Full flexion and full extension on the knee, some crepitus Pain in the groin in the left hip with internal and external rotation Calf circumference was equal Left hip pain: Complain of anterior groin in the left  Neurological: She is alert.  Reflex Scores:      Patellar reflexes are 2+ on the right side and 2+ on the left side.      Achilles reflexes are 2+ on the right side and 2+ on the left side. When I tested patellar reflex, she is withdrawing her leg  Skin: Skin is warm and dry.  Psychiatric: She has a normal mood and affect. Her behavior is normal.  Nursing note and vitals reviewed.  Dg Ankle Complete Left  09/01/2015  CLINICAL DATA:  Left ankle pain with no known injury. EXAM: LEFT ANKLE COMPLETE - 3+ VIEW  COMPARISON:  None. FINDINGS: No fracture or dislocation. Calcifications at the base of the fifth metatarsal likely represent sequela from an old avulsion. No acute fractures, dislocations, or significant soft tissue swelling. IMPRESSION: No acute abnormalities. Electronically Signed   By: Dorise Bullion III M.D   On: 09/01/2015 14:42   Dg Knee Complete 4 Views Left  09/01/2015  CLINICAL DATA:  Pain for 1 week EXAM: LEFT KNEE - COMPLETE 4+ VIEW COMPARISON:  None. FINDINGS: No evidence of fracture, dislocation, or joint effusion. No evidence of arthropathy or other focal bone abnormality. Soft tissues are unremarkable. IMPRESSION: Negative. Electronically Signed   By: Dorise Bullion III M.D   On: 09/01/2015 14:43   Dg Hip Unilat W Or W/o Pelvis 2-3 Views Left  09/01/2015  CLINICAL DATA:  Pain for 1 week.  No known injury. EXAM: DG HIP (WITH OR WITHOUT PELVIS) 2-3V LEFT COMPARISON:  None. FINDINGS: There is no evidence of hip fracture or dislocation. There is no evidence of arthropathy or other focal bone abnormality. IMPRESSION: Negative. Electronically Signed   By: Dorise Bullion III M.D   On: 09/01/2015 14:44   Results for orders placed or performed in visit on 09/01/15  POCT CBC  Result Value Ref Range   WBC 5.2 4.6 - 10.2 K/uL   Lymph, poc 2.0 0.6 - 3.4   POC LYMPH PERCENT 38.5 10 - 50 %L   MID (cbc) 0.3 0 - 0.9   POC MID % 6.6 0 - 12 %M   POC Granulocyte 2.9 2 - 6.9   Granulocyte percent 54.9 37 - 80 %G   RBC 4.37 4.04 - 5.48 M/uL   Hemoglobin 13.0 12.2 - 16.2 g/dL   HCT, POC 38.4 37.7 - 47.9 %   MCV 87.8 80 - 97 fL   MCH, POC 29.8 27 - 31.2 pg   MCHC 33.9 31.8 - 35.4 g/dL   RDW, POC 14.2 %   Platelet Count, POC 225 142 - 424 K/uL   MPV 7.6 0 - 99.8 fL  POCT glucose (manual entry)  Result Value Ref Range   POC Glucose 138 (A) 70 - 99 mg/dl    Assessment & Plan:    Michelle Macdonald is a 80 y.o. female Left knee pain - Plan: DG Knee Complete 4 Views Left Left hip pain -  Plan: DG HIP UNILAT W OR W/O PELVIS 2-3 VIEWS LEFT Left ankle pain - Plan: DG Ankle Complete Left  - Possible arthritis versus radicular pain from lumbar spine. Reflexes and strength intact, no known injury. Reassuring x-ray. Trial of Tylenol, follow-up with primary care provider, or RTC/ER precautions if worsening.  Dizziness - Plan: POCT CBC, POCT glucose (manual entry)  - Previously evaluated by her primary care provider, thought to be vertigo. Based on her description, likely was prescribed meclizine. However reassuring CBC, glucose in office, nonfocal neurologic exam. Advised to make sure she stays hydrated, relative rest, and follow-up with primary care provider to discuss further if not improving. RTC/ER precautions if worsening.  No orders of the defined types were placed in this encounter.  Patient Instructions    I do not see any concerning findings on your x-ray. Your pain may be due to arthritis of those joints, but overall the x-rays looked okay. Sometimes radiating pain from the back can also cause leg pain. Try Tylenol for the pain, and follow-up with your primary care provider next week if you're not improving. Sooner if worse.  Your blood sugar was slightly over normal today, but not at a concerning level, and your blood counts looked okay. Okay to continue the medication as recommended by your primary care provider for dizziness, but if any chest pain, worsening dizziness, or change in your symptoms, recommend you be checked by another medical care provider. Otherwise follow-up with your primary care provider regarding dizziness.  Return to the clinic or go to the nearest emergency room if any of your symptoms worsen or new symptoms occur.  Dizziness Dizziness is a common problem. It is a feeling of unsteadiness or light-headedness. You may feel like you are about to faint. Dizziness can lead to injury if you stumble or fall. Anyone can become dizzy, but dizziness is more common  in older adults. This condition can be  caused by a number of things, including medicines, dehydration, or illness. HOME CARE INSTRUCTIONS Taking these steps may help with your condition: Eating and Drinking  Drink enough fluid to keep your urine clear or pale yellow. This helps to keep you from becoming dehydrated. Try to drink more clear fluids, such as water.  Do not drink alcohol.  Limit your caffeine intake if directed by your health care provider.  Limit your salt intake if directed by your health care provider. Activity  Avoid making quick movements.  Rise slowly from chairs and steady yourself until you feel okay.  In the morning, first sit up on the side of the bed. When you feel okay, stand slowly while you hold onto something until you know that your balance is fine.  Move your legs often if you need to stand in one place for a long time. Tighten and relax your muscles in your legs while you are standing.  Do not drive or operate heavy machinery if you feel dizzy.  Avoid bending down if you feel dizzy. Place items in your home so that they are easy for you to reach without leaning over. Lifestyle  Do not use any tobacco products, including cigarettes, chewing tobacco, or electronic cigarettes. If you need help quitting, ask your health care provider.  Try to reduce your stress level, such as with yoga or meditation. Talk with your health care provider if you need help. General Instructions  Watch your dizziness for any changes.  Take medicines only as directed by your health care provider. Talk with your health care provider if you think that your dizziness is caused by a medicine that you are taking.  Tell a friend or a family member that you are feeling dizzy. If he or she notices any changes in your behavior, have this person call your health care provider.  Keep all follow-up visits as directed by your health care provider. This is important. SEEK MEDICAL CARE  IF:  Your dizziness does not go away.  Your dizziness or light-headedness gets worse.  You feel nauseous.  You have reduced hearing.  You have new symptoms.  You are unsteady on your feet or you feel like the room is spinning. SEEK IMMEDIATE MEDICAL CARE IF:  You vomit or have diarrhea and are unable to eat or drink anything.  You have problems talking, walking, swallowing, or using your arms, hands, or legs.  You feel generally weak.  You are not thinking clearly or you have trouble forming sentences. It may take a friend or family member to notice this.  You have chest pain, abdominal pain, shortness of breath, or sweating.  Your vision changes.  You notice any bleeding.  You have a headache.  You have neck pain or a stiff neck.  You have a fever.   This information is not intended to replace advice given to you by your health care provider. Make sure you discuss any questions you have with your health care provider.   Document Released: 07/23/2000 Document Revised: 06/13/2014 Document Reviewed: 01/23/2014 Elsevier Interactive Patient Education 2016 [employer].     IF you received an x-ray today, you will receive an invoice from [facility]. Please contact [facility] at [phone] with questions or concerns regarding your invoice.   IF you received labwork today, you will receive an invoice from [employer]. Please contact [facility] at [phone] with questions or concerns regarding your invoice.   Our billing staff will not be  able to assist you with questions regarding bills from these companies.  You will be contacted with the lab results as soon as they are available. The fastest way to get your results is to activate your My Chart account. Instructions are located on the last page of this paperwork. If you have not heard from [place] regarding the results in 2 weeks, please contact this office.      I  personally performed the services described in this documentation, which was scribed in my presence. The recorded information has been reviewed and considered, and addended by me as needed.   Signed,   Merri Ray, MD [facility].  09/02/15 8:40 PM

## 2015-09-01 NOTE — Patient Instructions (Addendum)
I do not see any concerning findings on your x-ray. Your pain may be due to arthritis of those joints, but overall the x-rays looked okay. Sometimes radiating pain from the back can also cause leg pain. Try Tylenol for the pain, and follow-up with your primary care provider next week if you're not improving. Sooner if worse.  Your blood sugar was slightly over normal today, but not at a concerning level, and your blood counts looked okay. Okay to continue the medication as recommended by your primary care provider for dizziness, but if any chest pain, worsening dizziness, or change in your symptoms, recommend you be checked by another medical care provider. Otherwise follow-up with your primary care provider regarding dizziness.  Return to the clinic or go to the nearest emergency room if any of your symptoms worsen or new symptoms occur.  Dizziness Dizziness is a common problem. It is a feeling of unsteadiness or light-headedness. You may feel like you are about to faint. Dizziness can lead to injury if you stumble or fall. Anyone can become dizzy, but dizziness is more common in older adults. This condition can be caused by a number of things, including medicines, dehydration, or illness. HOME CARE INSTRUCTIONS Taking these steps may help with your condition: Eating and Drinking  Drink enough fluid to keep your urine clear or pale yellow. This helps to keep you from becoming dehydrated. Try to drink more clear fluids, such as water.  Do not drink alcohol.  Limit your caffeine intake if directed by your health care provider.  Limit your salt intake if directed by your health care provider. Activity  Avoid making quick movements.  Rise slowly from chairs and steady yourself until you feel okay.  In the morning, first sit up on the side of the bed. When you feel okay, stand slowly while you hold onto something until you know that your balance is fine.  Move your legs often if you need to  stand in one place for a long time. Tighten and relax your muscles in your legs while you are standing.  Do not drive or operate heavy machinery if you feel dizzy.  Avoid bending down if you feel dizzy. Place items in your home so that they are easy for you to reach without leaning over. Lifestyle  Do not use any tobacco products, including cigarettes, chewing tobacco, or electronic cigarettes. If you need help quitting, ask your health care provider.  Try to reduce your stress level, such as with yoga or meditation. Talk with your health care provider if you need help. General Instructions  Watch your dizziness for any changes.  Take medicines only as directed by your health care provider. Talk with your health care provider if you think that your dizziness is caused by a medicine that you are taking.  Tell a friend or a family member that you are feeling dizzy. If he or she notices any changes in your behavior, have this person call your health care provider.  Keep all follow-up visits as directed by your health care provider. This is important. SEEK MEDICAL CARE IF:  Your dizziness does not go away.  Your dizziness or light-headedness gets worse.  You feel nauseous.  You have reduced hearing.  You have new symptoms.  You are unsteady on your feet or you feel like the room is spinning. SEEK IMMEDIATE MEDICAL CARE IF:  You vomit or have diarrhea and are unable to eat or drink anything.  You have problems  talking, walking, swallowing, or using your arms, hands, or legs.  You feel generally weak.  You are not thinking clearly or you have trouble forming sentences. It may take a friend or family member to notice this.  You have chest pain, abdominal pain, shortness of breath, or sweating.  Your vision changes.  You notice any bleeding.  You have a headache.  You have neck pain or a stiff neck.  You have a fever.   This information is not intended to replace advice  given to you by your health care provider. Make sure you discuss any questions you have with your health care provider.   Document Released: 07/23/2000 Document Revised: 06/13/2014 Document Reviewed: 01/23/2014 Elsevier Interactive Patient Education 2016 [employer].     IF you received an x-ray today, you will receive an invoice from [facility]. Please contact [facility] at [phone] with questions or concerns regarding your invoice.   IF you received labwork today, you will receive an invoice from [employer]. Please contact [facility] at [phone] with questions or concerns regarding your invoice.   Our billing staff will not be able to assist you with questions regarding bills from these companies.  You will be contacted with the lab results as soon as they are available. The fastest way to get your results is to activate your My Chart account. Instructions are located on the last page of this paperwork. If you have not heard from [place] regarding the results in 2 weeks, please contact this office.

## 2015-09-23 ENCOUNTER — Encounter (HOSPITAL_COMMUNITY): Payer: Self-pay | Admitting: Emergency Medicine

## 2015-09-23 ENCOUNTER — Emergency Department (HOSPITAL_COMMUNITY)
Admission: EM | Admit: 2015-09-23 | Discharge: 2015-09-23 | Disposition: A | Discharge status: Home / Self Care | Payer: Medicare Other | Attending: Emergency Medicine | Admitting: Emergency Medicine

## 2015-09-23 ENCOUNTER — Emergency Department (HOSPITAL_BASED_OUTPATIENT_CLINIC_OR_DEPARTMENT_OTHER)
Admission: EM | Admit: 2015-09-23 | Discharge: 2015-09-23 | Disposition: A | Discharge status: Home / Self Care | Payer: Medicare Other | Source: Home / Self Care

## 2015-09-23 ENCOUNTER — Emergency Department (HOSPITAL_COMMUNITY): Payer: Medicare Other

## 2015-09-23 DIAGNOSIS — Z8543 Personal history of malignant neoplasm of ovary: Secondary | ICD-10-CM | POA: Insufficient documentation

## 2015-09-23 DIAGNOSIS — Z79899 Other long term (current) drug therapy: Secondary | ICD-10-CM | POA: Insufficient documentation

## 2015-09-23 DIAGNOSIS — Z7984 Long term (current) use of oral hypoglycemic drugs: Secondary | ICD-10-CM | POA: Insufficient documentation

## 2015-09-23 DIAGNOSIS — I10 Essential (primary) hypertension: Secondary | ICD-10-CM | POA: Insufficient documentation

## 2015-09-23 DIAGNOSIS — M25462 Effusion, left knee: Secondary | ICD-10-CM | POA: Diagnosis not present

## 2015-09-23 DIAGNOSIS — M25562 Pain in left knee: Secondary | ICD-10-CM

## 2015-09-23 DIAGNOSIS — E119 Type 2 diabetes mellitus without complications: Secondary | ICD-10-CM | POA: Diagnosis not present

## 2015-09-23 DIAGNOSIS — M79609 Pain in unspecified limb: Secondary | ICD-10-CM | POA: Diagnosis not present

## 2015-09-23 MED ORDER — HYDROCODONE-ACETAMINOPHEN 5-325 MG PO TABS
1.0000 | ORAL_TABLET | Freq: Once | ORAL | Status: AC
  Administered 2015-09-23: 1 via ORAL
  Filled 2015-09-23: qty 1

## 2015-09-23 MED ORDER — ONDANSETRON 4 MG PO TBDP
4.0000 mg | ORAL_TABLET | Freq: Once | ORAL | Status: AC
  Administered 2015-09-23: 4 mg via ORAL
  Filled 2015-09-23: qty 1

## 2015-09-23 MED ORDER — HYDROCODONE-ACETAMINOPHEN 5-325 MG PO TABS: 1.0000 | ORAL_TABLET | ORAL | 0 refills | Status: DC | PRN

## 2015-09-23 NOTE — ED Notes (Signed)
Patient transported to X-ray 

## 2015-09-23 NOTE — ED Provider Notes (Signed)
[facility] DEPT Provider Note   CSN: [id number] Arrival date & time: 09/23/15  1048  First Provider Contact:  First MD Initiated Contact with Patient 09/23/15 1126        History   Chief Complaint Chief Complaint  Patient presents with  . Leg Pain    HPI Michelle Macdonald is a 80 y.o. female who presents for knee pain. She has a past medical history of osteoarthritis of the knees. She has a previous history of gout in her left ankle. She states that she's had swelling and tightness in the left knee joint since yesterday. She states that when she tries to extend her knees. It "feels like something is pulling" in the back of the knee. She denies any leg pain, heaviness. She does have some swelling below the knee. She denies any history of DVT or PE, chest pain, shortness of breath. She's been taking medication at home without significant improvement. She denies any heat or redness. She does not have any known injury to the knee. She denies fevers, chills or other signs of systemic infection. She states this does not feel like her previous gout flare.  HPI  Past Medical History:  Diagnosis Date  . Arthritis    fingers   . Cancer ([place])    ovarian cancer  . Depression   . Hyperlipidemia   . Hypertension     Patient Active Problem List   Diagnosis Date Noted  . Malignant neoplasm of corpus uteri, except isthmus ([place]) 07/19/2013  . Endometrial cancer ([place]) 06/23/2013  . Postmenopausal bleeding 06/09/2013  . DIABETES MELLITUS, TYPE II 07/11/2006  . HYPERLIPIDEMIA 07/11/2006  . SCHIZOPHRENIA NOS, UNSPECIFIED 07/11/2006  . HYPERTENSION 07/11/2006  . DYSPEPSIA, HX OF 07/11/2006    Past Surgical History:  Procedure Laterality Date  . ROBOTIC ASSISTED TOTAL HYSTERECTOMY WITH BILATERAL SALPINGO OOPHERECTOMY Bilateral 07/19/2013   Procedure: ROBOTIC ASSISTED TOTAL HYSTERECTOMY WITH BILATERAL SALPINGO OOPHORECTOMY and lymph node dissecton bilateral;  Surgeon: [name], MD;   Location: [facility];  Service: Gynecology;  Laterality: Bilateral;    OB History    Gravida Para Term Preterm AB Living   3 3 3  0 0 1   SAB TAB Ectopic Multiple Live Births   0 0 0 0         Home Medications    Prior to Admission medications   Medication Sig Start Date End Date Taking? Authorizing Provider  amLODipine (NORVASC) 2.5 MG tablet Take 2.5 mg by mouth daily.    Yes Historical Provider, MD  atorvastatin (LIPITOR) 20 MG tablet Take 20 mg by mouth at bedtime.    Yes Historical Provider, MD  BAYER CONTOUR TEST test strip  12/26/14  Yes Historical Provider, MD  meclizine (ANTIVERT) 12.5 MG tablet Take 12.5 mg by mouth 3 (three) times daily as needed for dizziness.  08/30/15  Yes Historical Provider, MD  metFORMIN (GLUCOPHAGE) 500 MG tablet Take 500 mg by mouth 2 (two) times daily with a meal.   Yes Historical Provider, MD  MICROLET LANCETS [place]  01/19/15  Yes Historical Provider, MD  Multiple Vitamin (MULTIVITAMIN WITH MINERALS) TABS tablet Take 1 tablet by mouth daily.   Yes Historical Provider, MD  thioridazine (MELLARIL) 25 MG tablet Take 50 mg by mouth at bedtime.    Yes Historical Provider, MD  HYDROcodone-acetaminophen (NORCO) 5-325 MG tablet Take 1 tablet by mouth every 4 (four) hours as needed for severe pain (Only for SEVERE pain). 09/23/15   Margarita Mail, PA-C  Family History Family History  Problem Relation Age of Onset  . Heart disease Mother   . Heart disease Sister     Social History Social History  Substance Use Topics  . Smoking status: Never Smoker  . Smokeless tobacco: Never Used  . Alcohol use No     Allergies   Review of patient's allergies indicates no known allergies.   Review of Systems Review of Systems Ten systems reviewed and are negative for acute change, except as noted in the HPI.    Physical Exam Updated Vital Signs BP 114/66 (BP Location: Right Arm)   Pulse 81   Temp 98.2 F (36.8 C) (Oral)   Resp 18   SpO2 95%   Physical  Exam Physical Exam  Nursing note and vitals reviewed. Constitutional: She is oriented to person, place, and time. She appears well-developed and well-nourished. No distress.  HENT:  Head: Normocephalic and atraumatic.  Eyes: Conjunctivae normal and EOM are normal. Pupils are equal, round, and reactive to light. No scleral icterus.  Neck: Normal range of motion.  Cardiovascular: Normal rate, regular rhythm and normal heart sounds.  Exam reveals no gallop and no friction rub.   No murmur heard. Pulmonary/Chest: Effort normal and breath sounds normal. No respiratory distress.  Abdominal: Soft. Bowel sounds are normal. She exhibits no distension and no mass. There is no tenderness. There is no guarding.  Neurological: She is alert and oriented to person, place, and time.  Musculoskeletal: Left knee with mild swelling, range of motion is limited due to swelling. No ligamentous instability, no heat.  SWELLING in the calf below the left knee, strong distal pulses, normal sensation Skin: Skin is warm and dry. She is not diaphoretic.     ED Treatments / Results  Labs (all labs ordered are listed, but only abnormal results are displayed) Labs Reviewed - No data to display  EKG  EKG Interpretation None       Radiology Dg Knee Complete 4 Views Left  Result Date: 09/23/2015 CLINICAL DATA:  Left posterior knee pain x1 week, difficult to bear weight now. Denies injury or previous surgery. EXAM: LEFT KNEE - COMPLETE 4+ VIEW COMPARISON:  None. FINDINGS: No fracture.  No bone lesion. Knee joint is normally spaced and aligned. No significant arthropathic change. Small joint effusion. Soft tissues are unremarkable. IMPRESSION: 1. No fracture, bone lesion or arthropathic change. 2. Small joint effusion. Electronically Signed   By: Lajean Manes M.D.   On: 09/23/2015 12:49    Procedures Procedures (including critical care time)  Medications Ordered in ED Medications  HYDROcodone-acetaminophen  (NORCO/VICODIN) 5-325 MG per tablet 1 tablet (1 tablet Oral Given 09/23/15 1227)  ondansetron (ZOFRAN-ODT) disintegrating tablet 4 mg (4 mg Oral Given 09/23/15 1227)     Initial Impression / Assessment and Plan / ED Course  I have reviewed the triage vital signs and the nursing notes.  Pertinent labs & imaging results that were available during my care of the patient were reviewed by me and considered in my medical decision making (see chart for details).  Clinical Course    Patient with joint effusion of the left knee which is small, no evidence of Baker's cyst or DVT on ultrasound study. No concern for septic joint or gout flare. She is ambulatory. Will discharge the patient with pain medication. Follow-up with PCP or orthopedist. She appears safe for discharge at this time.  Final Clinical Impressions(s) / ED Diagnoses   Final diagnoses:  Knee effusion, left  Knee pain, acute, left    New Prescriptions Discharge Medication List as of 09/23/2015  1:51 PM    START taking these medications   Details  HYDROcodone-acetaminophen (NORCO) 5-325 MG tablet Take 1 tablet by mouth every 4 (four) hours as needed for severe pain (Only for SEVERE pain)., Starting Sun 09/23/2015, Print         Margarita Mail, PA-C 09/23/15 [place], MD 09/24/15 [phone]

## 2015-09-23 NOTE — ED Triage Notes (Addendum)
Patient presents for left leg pain x1 week. Reports history of arthritis but no relief with OTC medications. No known injury to same. States "feels like something is pulling behind by knee". No obvious deformity, no swelling, color of extremity is normal for ethnicity, warm to touch.

## 2015-09-23 NOTE — ED Notes (Signed)
US at bedside

## 2015-09-23 NOTE — Progress Notes (Signed)
VASCULAR LAB PRELIMINARY  PRELIMINARY  PRELIMINARY  PRELIMINARY  Left lower extremity venous duplex completed.    Preliminary report:  There is no DVT, SVT, or Baker's cyst noted in the left lower extremity.   Ercel Normoyle, RVT 09/23/2015, 1:26 PM

## 2015-11-08 ENCOUNTER — Ambulatory Visit: Payer: Medicare Other | Attending: Gynecologic Oncology | Admitting: Gynecologic Oncology

## 2015-11-08 ENCOUNTER — Encounter: Payer: Self-pay | Admitting: Gynecologic Oncology

## 2015-11-08 VITALS — BP 135/70 | HR 69 | Temp 98.8°F | Resp 18 | Ht 61.0 in | Wt 154.3 lb

## 2015-11-08 DIAGNOSIS — Z8543 Personal history of malignant neoplasm of ovary: Secondary | ICD-10-CM | POA: Insufficient documentation

## 2015-11-08 DIAGNOSIS — I10 Essential (primary) hypertension: Secondary | ICD-10-CM | POA: Insufficient documentation

## 2015-11-08 DIAGNOSIS — F329 Major depressive disorder, single episode, unspecified: Secondary | ICD-10-CM | POA: Diagnosis not present

## 2015-11-08 DIAGNOSIS — C541 Malignant neoplasm of endometrium: Secondary | ICD-10-CM

## 2015-11-08 DIAGNOSIS — Z8542 Personal history of malignant neoplasm of other parts of uterus: Secondary | ICD-10-CM | POA: Insufficient documentation

## 2015-11-08 DIAGNOSIS — E785 Hyperlipidemia, unspecified: Secondary | ICD-10-CM | POA: Diagnosis not present

## 2015-11-08 NOTE — Progress Notes (Signed)
Office Visit:  GYN ONCOLOGY   Michelle Macdonald 80 y.o. female  Chief Complaint:  Endometrial cancer surveillance  Assessment : Stage IA grade 3 endometrial carcinoma no myoinvasion, no LVSI staged 07/19/2013  Plan: No adjuvant therapy indicated. No evidence of disease Follow-up with Gyn Onc in 6 months   HPI: 80 y.o.  who went through menopause in her early 83s and had no bleeding until approximately a month ago when she had bleeding for 2 weeks. The bleeding has stopped. Patient saw [name] on 06/09/2013. An endometrial biopsy was obtained revealing a grade 3 endometrial adenocarcinoma.  Patient denies any other pelvic symptoms including pain or pressure. She has no past gynecologic history. She has one cousin who had endometrial carcinoma. She denies any family history of colon cancer..  On 07/19/2013 she underwent RATLH BSO BPLND PALNS. Path ONCOLOGY TABLE-UTERUS, CARCINOMA Specimen: Uterus, cervix, bilateral ovaries and fallopian tubes. Procedure: Total hysterectomy and bilateral salpingo-oophorectomy. Lymph node sampling performed: Yes Specimen integrity: Intact. Maximum tumor size: 0.5 cm. Histologic type: Endometrioid carcinoma. Grade: FIGO Grade II Myometrial invasion: 0 cm where myometrium is 2.5 cm in thickness Cervical stromal involvement: No Extent of involvement of other organs: No Lymph - vascular invasion: Not identified. Peritoneal washings: N/A Lymph nodes: # examined 0 ; # positive 22 Pelvic lymph nodes: 0 involved of 17 lymph nodes. Para-aortic lymph nodes: 0 involved of 5 lymph nodes.  Pap 2/17 notable for benign appearing glands otherwise no dysplastic changes  Review of Systems: No visual changes, no n/v reports constipation, no incontinence, no vaginal bleeding or discharge no rectal or bladder bleeding  No flank pain no bruising. No swelling of extremitites, no cough, no chest pain, no bloating, good appetite. Otherwise 10 point review of systems  is negative   Past Medical History:  Diagnosis Date  . Arthritis    fingers   . Cancer ([place])    ovarian cancer  . Depression   . Hyperlipidemia   . Hypertension     Past Surgical History:  Procedure Laterality Date  . ROBOTIC ASSISTED TOTAL HYSTERECTOMY WITH BILATERAL SALPINGO OOPHERECTOMY Bilateral 07/19/2013   Procedure: ROBOTIC ASSISTED TOTAL HYSTERECTOMY WITH BILATERAL SALPINGO OOPHORECTOMY and lymph node dissecton bilateral;  Surgeon: [name], MD;  Location: [facility];  Service: Gynecology;  Laterality: Bilateral;     Social History   Social History  . Marital status: Widowed    Spouse name: N/A  . Number of children: N/A  . Years of education: N/A   Occupational History  . Not on file.   Social History Main Topics  . Smoking status: Never Smoker  . Smokeless tobacco: Never Used  . Alcohol use No  . Drug use: No  . Sexual activity: Not on file   Other Topics Concern  . Not on file   Social History Narrative  . No narrative on file    Family History  Problem Relation Age of Onset  . Heart disease Mother   . Heart disease Sister     Vitals: BP 135/70 (BP Location: Right Arm, Patient Position: Sitting)   Pulse 69   Temp 98.8 F (37.1 C) (Oral)   Resp 18   Ht 5\' 1"  (1.549 m)   Wt 154 lb 4.8 oz (70 kg)   SpO2 97%   BMI 29.15 kg/m   Physical Exam: General : The patient is a healthy woman in no acute distress. Chest:  CTA Cardiac RRR  HEENT: normocephalic, extraoccular movements normal;  Lymph  nodes: Supraclavicular and inguinal nodes not enlarged Back:  No CVAT  Abdomen: Soft, non-tender, no ascites, no organomegally, no masses, no hernias,  Port sites without tenderness,mass or hernia Pelvic: Nl EGBUS, grade 2 cystocele, cuff intact. Grade 1 enterocele, grade 1 rectocele, vagina atrophic no masses or cul-de-sac nodularity  No vaginal bleeding or discharge Rectal:  Good tone no masses, poor anterior rectal wall tone Lower extremities: No  edema or varicosities. Normal range of motion

## 2015-11-08 NOTE — Patient Instructions (Signed)
Follow up in March 2018 with Dr [name] , call in December to schedule your appointment [phone].  Thank you

## 2016-04-28 NOTE — Progress Notes (Signed)
Office Visit:  GYN ONCOLOGY   Michelle Macdonald 81 y.o. female  Chief Complaint:  Endometrial cancer surveillance  Assessment : Stage IA grade 3 endometrial carcinoma no myoinvasion, no LVSI staged 07/19/2013  Plan: No adjuvant therapy indicated. No evidence of disease Follow-up with Gyn Onc in 6 months   [name] breast examination and has no interest in breast cancer screening. She was counseled to reconsider this position.   HPI: 81 y.o.  who went through menopause in her early 41s and had no bleeding until approximately a month ago when she had bleeding for 2 weeks. The bleeding has stopped. Patient saw [name] on 06/09/2013. An endometrial biopsy was obtained revealing a grade 3 endometrial adenocarcinoma.  Patient denies any other pelvic symptoms including pain or pressure. She has no past gynecologic history. She has one cousin who had endometrial carcinoma. She denies any family history of colon cancer.  On 07/19/2013 she underwent RATLH BSO BPLND PALNS.  Path ONCOLOGY TABLE-UTERUS, CARCINOMA Specimen: Uterus, cervix, bilateral ovaries and fallopian tubes. Procedure: Total hysterectomy and bilateral salpingo-oophorectomy. Lymph node sampling performed: Yes Specimen integrity: Intact. Maximum tumor size: 0.5 cm. Histologic type: Endometrioid carcinoma. Grade: FIGO Grade II Myometrial invasion: 0 cm where myometrium is 2.5 cm in thickness Cervical stromal involvement: No Extent of involvement of other organs: No Lymph - vascular invasion: Not identified. Peritoneal washings: N/A Lymph nodes: # examined 0 ; # positive 22 Pelvic lymph nodes: 0 involved of 17 lymph nodes. Para-aortic lymph nodes: 0 involved of 5 lymph nodes.  Pap 2/17 notable for benign appearing glands otherwise no dysplastic changes  Review of Systems: No visual changes, no n/v, no incontinence, no vaginal bleeding or discharge no rectal or bladder bleeding  No flank pain no bruising.  No swelling of extremitites, no cough, no chest pain, no bloating, good appetite. Otherwise 10 point review of systems is negative   Past Medical History:  Diagnosis Date  . Arthritis    fingers   . Cancer ([place])    ovarian cancer  . Depression   . Hyperlipidemia   . Hypertension   Has decided to forego breast cancer screening  Past Surgical History:  Procedure Laterality Date  . ROBOTIC ASSISTED TOTAL HYSTERECTOMY WITH BILATERAL SALPINGO OOPHERECTOMY Bilateral 07/19/2013   Procedure: ROBOTIC ASSISTED TOTAL HYSTERECTOMY WITH BILATERAL SALPINGO OOPHORECTOMY and lymph node dissecton bilateral;  Surgeon: [name], MD;  Location: [facility];  Service: Gynecology;  Laterality: Bilateral;     Social History   Social History  . Marital status: Widowed    Spouse name: N/A  . Number of children: N/A  . Years of education: N/A   Occupational History  . Not on file.   Social History Main Topics  . Smoking status: Never Smoker  . Smokeless tobacco: Never Used  . Alcohol use No  . Drug use: No  . Sexual activity: Not on file   Other Topics Concern  . Not on file   Social History Narrative  . No narrative on file    Family History  Problem Relation Age of Onset  . Heart disease Mother   . Heart disease Sister     Vitals: BP (!) 175/78 (BP Location: Left Arm, Patient Position: Sitting) Comment: notified RN  Pulse 67   Resp 20   Wt 154 lb (69.9 kg)   BMI 29.10 kg/m   Physical Exam: General : The patient is a healthy woman in no acute distress. Breast:  Declines examination Chest:  CTA Cardiac RRR  HEENT: normocephalic, extraoccular movements normal;  Lymph nodes: Supraclavicular and inguinal nodes not enlarged Back:  No CVAT  Abdomen: Soft, non-tender, no ascites, no organomegally, no masses, no hernias,  Port sites without tenderness,mass or hernia Pelvic: Nl EGBUS, grade 2 cystocele, cuff intact. Grade 1 enterocele, grade 1 rectocele, vagina atrophic no masses or  cul-de-sac nodularity  No vaginal bleeding or discharge Rectal:  Good tone no masses, poor anterior rectal wall tone Lower extremities: No clubbing cyanosis or edema

## 2016-04-29 ENCOUNTER — Encounter: Payer: Self-pay | Admitting: Gynecologic Oncology

## 2016-04-29 ENCOUNTER — Ambulatory Visit: Payer: Medicare Other | Attending: Gynecologic Oncology | Admitting: Gynecologic Oncology

## 2016-04-29 VITALS — BP 175/78 | HR 67 | Resp 20 | Wt 154.0 lb

## 2016-04-29 DIAGNOSIS — C541 Malignant neoplasm of endometrium: Secondary | ICD-10-CM | POA: Diagnosis present

## 2016-04-29 NOTE — Patient Instructions (Addendum)
Please have your mammogram done as recommended by Dr [name]. And follow up in 6 months with Dr Skeet Latch.

## 2016-05-23 ENCOUNTER — Telehealth: Payer: Self-pay | Admitting: *Deleted

## 2016-05-23 NOTE — Telephone Encounter (Signed)
"  I was told I need to see Dr. Skeet Latch in six months.  I know this will be October 30, 2016 but I was not given a time.  Do I come in the same time at 10:30 am?"  Advised this has not been scheduled yet.  Call transferred to GYN Oncology [phone].

## 2016-08-19 ENCOUNTER — Telehealth: Payer: Self-pay | Admitting: *Deleted

## 2016-08-19 NOTE — Telephone Encounter (Signed)
Patient called and scheduled her follow up appt  For September 27th

## 2016-10-27 ENCOUNTER — Telehealth: Payer: Self-pay | Admitting: *Deleted

## 2016-10-27 NOTE — Telephone Encounter (Signed)
Returned patient's phone call, patient left message to cancel appt on September 27th. Patient doesn't want to reschedule at this time.

## 2016-11-06 ENCOUNTER — Ambulatory Visit: Payer: Medicare Other | Admitting: Gynecologic Oncology

## 2017-05-06 ENCOUNTER — Emergency Department (HOSPITAL_COMMUNITY)
Admission: EM | Admit: 2017-05-06 | Discharge: 2017-05-06 | Disposition: A | Discharge status: Home / Self Care | Payer: Medicare Other | Attending: Emergency Medicine | Admitting: Emergency Medicine

## 2017-05-06 ENCOUNTER — Other Ambulatory Visit: Payer: Self-pay

## 2017-05-06 ENCOUNTER — Encounter (HOSPITAL_COMMUNITY): Payer: Self-pay

## 2017-05-06 ENCOUNTER — Emergency Department (HOSPITAL_COMMUNITY): Payer: Medicare Other

## 2017-05-06 DIAGNOSIS — I10 Essential (primary) hypertension: Secondary | ICD-10-CM | POA: Diagnosis not present

## 2017-05-06 DIAGNOSIS — R42 Dizziness and giddiness: Secondary | ICD-10-CM | POA: Diagnosis present

## 2017-05-06 DIAGNOSIS — Z8543 Personal history of malignant neoplasm of ovary: Secondary | ICD-10-CM | POA: Insufficient documentation

## 2017-05-06 DIAGNOSIS — Z7984 Long term (current) use of oral hypoglycemic drugs: Secondary | ICD-10-CM | POA: Insufficient documentation

## 2017-05-06 DIAGNOSIS — H9313 Tinnitus, bilateral: Secondary | ICD-10-CM | POA: Insufficient documentation

## 2017-05-06 DIAGNOSIS — E119 Type 2 diabetes mellitus without complications: Secondary | ICD-10-CM | POA: Diagnosis not present

## 2017-05-06 MED ORDER — MECLIZINE HCL 25 MG PO TABS
25.0000 mg | ORAL_TABLET | Freq: Once | ORAL | Status: AC
  Administered 2017-05-06: 25 mg via ORAL
  Filled 2017-05-06: qty 1

## 2017-05-06 MED ORDER — MECLIZINE HCL 25 MG PO TABS: 25.0000 mg | ORAL_TABLET | Freq: Three times a day (TID) | ORAL | 0 refills | Status: DC | PRN

## 2017-05-06 NOTE — ED Triage Notes (Signed)
Patient complains of ringing in her ears x 2 days with dizziness x 1 day. States dizziness worse with laying flat and any change in position, no neuro deficits. Alert and oriented

## 2017-05-06 NOTE — Discharge Instructions (Addendum)
You may take a meclizine every 8 hours as needed for dizziness.  Do not take Benadryl if you are taking this medication.  This medication may make you sleepy so do not drive while taking the medication.  Follow-up with your primary care provider if you continue to have problems with dizziness.  Return to ER immediately if you have sudden onset of severe headache, weakness in arm or leg, focal numbness, vision changes, speech problems, or confusion.

## 2017-05-06 NOTE — ED Provider Notes (Signed)
[facility] EMERGENCY DEPARTMENT Provider Note   CSN: [id number] Arrival date & time: 05/06/17  4097     History   Chief Complaint No chief complaint on file.   HPI Michelle Macdonald is a 82 y.o. female.  82 year old female with past medical history including ovarian cancer, hypertension, hyperlipidemia who presents with dizziness.  Patient states that for the past 3 days, she has had constant dizziness that she describes as room spinning sensation and feeling off balance.  It is worse when she lays on her left side.  Several days before the dizziness started, she has had bilateral tinnitus without any hearing loss.  She denies any extremity numbness or weakness.  She feels off balance when trying to walk.  She had an episode of vomiting a few days ago but none recently.  No fevers or recent URI symptoms.  No headache or vision changes.  She has had dizziness in the past but states that this episode is worse than previous episodes.  She has not taken her blood pressure medications yet today.  The history is provided by the patient.    Past Medical History:  Diagnosis Date  . Arthritis    fingers   . Cancer ([place])    ovarian cancer  . Depression   . Hyperlipidemia   . Hypertension     Patient Active Problem List   Diagnosis Date Noted  . Malignant neoplasm of corpus uteri, except isthmus ([place]) 07/19/2013  . Endometrial cancer ([place]) 06/23/2013  . Postmenopausal bleeding 06/09/2013  . DIABETES MELLITUS, TYPE II 07/11/2006  . HYPERLIPIDEMIA 07/11/2006  . SCHIZOPHRENIA NOS, UNSPECIFIED 07/11/2006  . HYPERTENSION 07/11/2006  . DYSPEPSIA, HX OF 07/11/2006    Past Surgical History:  Procedure Laterality Date  . ROBOTIC ASSISTED TOTAL HYSTERECTOMY WITH BILATERAL SALPINGO OOPHERECTOMY Bilateral 07/19/2013   Procedure: ROBOTIC ASSISTED TOTAL HYSTERECTOMY WITH BILATERAL SALPINGO OOPHORECTOMY and lymph node dissecton bilateral;  Surgeon: [name], MD;   Location: [facility];  Service: Gynecology;  Laterality: Bilateral;     OB History    Gravida  3   Para  3   Term  3   Preterm  0   AB  0   Living  1     SAB  0   TAB  0   Ectopic  0   Multiple  0   Live Births               Home Medications    Prior to Admission medications   Medication Sig Start Date End Date Taking? Authorizing Provider  acetaminophen (TYLENOL) 500 MG tablet Take 500 mg by mouth every 6 (six) hours as needed for mild pain.   Yes [provider]  amLODipine (NORVASC) 2.5 MG tablet Take 2.5 mg by mouth daily.    Yes [provider]  metFORMIN (GLUCOPHAGE) 500 MG tablet Take 250-500 mg by mouth 2 (two) times daily with a meal. 500 mg in the morning and 250 mg at bedtime   Yes [provider]  [place]  01/19/15  Yes [provider]  Multiple Vitamin (MULTIVITAMIN WITH MINERALS) TABS tablet Take 1 tablet by mouth daily.   Yes [provider]  thioridazine (MELLARIL) 25 MG tablet Take 50 mg by mouth at bedtime.    Yes [provider]  meclizine (ANTIVERT) 25 MG tablet Take 1 tablet (25 mg total) by mouth 3 (three) times daily as needed for dizziness. 05/06/17   Yazlyn Wentzel, Apolonio Schneiders  Lilia Pro, MD    Family History Family History  Problem Relation Age of Onset  . Heart disease Mother   . Heart disease Sister     Social History Social History   Tobacco Use  . Smoking status: Never Smoker  . Smokeless tobacco: Never Used  Substance Use Topics  . Alcohol use: No  . Drug use: No     Allergies   Patient has no known allergies.   Review of Systems Review of Systems All other systems reviewed and are negative except that which was mentioned in HPI   Physical Exam Updated Vital Signs BP (!) 174/79   Pulse 69   Temp 98.3 F (36.8 C)   Resp 13   SpO2 99%   Physical Exam  Constitutional: She is oriented to person, place, and time. She appears well-developed and well-nourished. No  distress.  Awake, alert  HENT:  Head: Normocephalic and atraumatic.  Eyes: Pupils are equal, round, and reactive to light. Conjunctivae and EOM are normal.  Neck: Neck supple.  Cardiovascular: Normal rate, regular rhythm and normal heart sounds.  No murmur heard. Pulmonary/Chest: Effort normal and breath sounds normal. No respiratory distress.  Abdominal: Soft. Bowel sounds are normal. She exhibits no distension. There is no tenderness.  Musculoskeletal: She exhibits no edema.  Neurological: She is alert and oriented to person, place, and time. She has normal reflexes. No cranial nerve deficit. She exhibits normal muscle tone.  Fluent speech, normal finger-to-nose testing, negative pronator drift, no clonus; slow, steady gait with no ataxia; negative Romberg 5/5 strength and normal sensation x all 4 extremities  Skin: Skin is warm and dry.  Psychiatric: She has a normal mood and affect. Judgment and thought content normal.  Nursing note and vitals reviewed.    ED Treatments / Results  Labs (all labs ordered are listed, but only abnormal results are displayed) Labs Reviewed - No data to display  EKG EKG Interpretation  Date/Time:  Wednesday May 06 2017 09:44:23 EDT Ventricular Rate:  77 PR Interval:  126 QRS Duration: 68 QT Interval:  378 QTC Calculation: 427 R Axis:   62 Text Interpretation:  Normal sinus rhythm Nonspecific T wave abnormality Abnormal ECG No significant change since last tracing Confirmed by Theotis Burrow [phone]) on 05/06/2017 3:12:56 PM   Radiology Ct Head Wo Contrast  Result Date: 05/06/2017 CLINICAL DATA:  Gait imbalance. EXAM: CT HEAD WITHOUT CONTRAST TECHNIQUE: Contiguous axial images were obtained from the base of the skull through the vertex without intravenous contrast. COMPARISON:  CT HEAD December 02, 2012 FINDINGS: BRAIN: No intraparenchymal hemorrhage, mass effect nor midline shift. The ventricles and sulci are normal for age. Patchy  supratentorial white matter hypodensities within normal range for patient's age, though non-specific are most compatible with chronic small vessel ischemic disease. No acute large vascular territory infarcts. No abnormal extra-axial fluid collections. Basal cisterns are patent. VASCULAR: Mild calcific atherosclerosis of the carotid siphons. SKULL: No skull fracture. No significant scalp soft tissue swelling. SINUSES/ORBITS: The mastoid air-cells and included paranasal sinuses are well-aerated.The included ocular globes and orbital contents are non-suspicious. OTHER: None. IMPRESSION: Normal noncontrast CT HEAD for age. Electronically Signed   By: Elon Alas M.D.   On: 05/06/2017 16:34    Procedures Procedures (including critical care time)  Medications Ordered in ED Medications  meclizine (ANTIVERT) tablet 25 mg (25 mg Oral Given 05/06/17 1650)     Initial Impression / Assessment and Plan / ED Course  I have reviewed the triage  vital signs and the nursing notes.  Pertinent labs & imaging results that were available during my care of the patient were reviewed by me and considered in my medical decision making (see chart for details).     Patient was well-appearing on exam.  She was hypertensive but had not had her medications yet today.  Normal neurologic exam including normal gait.  No nystagmus.  She does report that turning head to left or laying on left side worsens her symptoms.  I reviewed her chart which shows a visit in 2014 for similar symptoms.  She has no red flag symptoms or concerning neurologic deficits today to suggest a central cause such as cerebellar stroke.  Head CT negative for hemorrhage, mass, or other acute process.  Gave the patient meclizine.  Reassessment, she stated that her symptoms had improved and she was able to ambulate independently.  I discussed supportive measures, PCP follow-up for persistent symptoms.  Extensively reviewed return precautions including  the development of any sudden and neurologic changes.  She voiced understanding.  Final Clinical Impressions(s) / ED Diagnoses   Final diagnoses:  Dizziness    ED Discharge Orders        Ordered    meclizine (ANTIVERT) 25 MG tablet  3 times daily PRN     05/06/17 1736       Johanna Matto, Wenda Overland, MD 05/06/17 [phone]

## 2017-05-06 NOTE — ED Notes (Signed)
Patient transported to CT. Will medication when she returns.

## 2017-09-30 ENCOUNTER — Encounter (HOSPITAL_COMMUNITY): Payer: Self-pay | Admitting: Family Medicine

## 2017-09-30 ENCOUNTER — Ambulatory Visit (HOSPITAL_COMMUNITY)
Admission: EM | Admit: 2017-09-30 | Discharge: 2017-09-30 | Disposition: A | Discharge status: Home / Self Care | Payer: Medicare Other | Attending: Family Medicine | Admitting: Family Medicine

## 2017-09-30 ENCOUNTER — Other Ambulatory Visit: Payer: Self-pay

## 2017-09-30 DIAGNOSIS — K649 Unspecified hemorrhoids: Secondary | ICD-10-CM

## 2017-09-30 NOTE — ED Triage Notes (Signed)
Pt has a cyst on her rectum. X 1 week

## 2017-09-30 NOTE — ED Provider Notes (Signed)
[place] [facility]   [id number] 09/30/17 Arrival Time: 0973  ASSESSMENT & PLAN:  1. Hemorrhoids, unspecified hemorrhoid type    She prefers to continue hot baths. Feels this is improving.  Follow-up Information    Nolene Ebbs, MD.   Specialty:  Internal Medicine Why:  If symptoms worsen. Contact information: [place] [address] [phone]          May f/u here if needed.  Reviewed expectations re: course of current medical issues. Questions answered. Outlined signs and symptoms indicating need for more acute intervention. Patient verbalized understanding. After Visit Summary given.   SUBJECTIVE:  Michelle Macdonald is a 82 y.o. female who presents with complaint of intermittent discomfort around rectum for the past week. 'Feels like there's a cyst there'. Overall improving. Occasional straining with bowel movements, without blood. No blood in stool reported. Afebrile. Without n/v. Normal appetite and PO intake. No abdominal discomfort. Normal urinary habits. Soaking in hot bath helps. No rectal itching. No specific aggravating factors reported. OTC treatment: none.  No LMP recorded. Patient is postmenopausal.   Past Surgical History:  Procedure Laterality Date  . ROBOTIC ASSISTED TOTAL HYSTERECTOMY WITH BILATERAL SALPINGO OOPHERECTOMY Bilateral 07/19/2013   Procedure: ROBOTIC ASSISTED TOTAL HYSTERECTOMY WITH BILATERAL SALPINGO OOPHORECTOMY and lymph node dissecton bilateral;  Surgeon: [name], MD;  Location: [facility];  Service: Gynecology;  Laterality: Bilateral;    ROS: As per HPI.  OBJECTIVE:  Vitals:   09/30/17 1522 09/30/17 1524  BP: (!) 142/89   Pulse: 76   Resp: 18   Temp: 98.6 F (37 C)   SpO2: 100%   Weight:  70.3 kg    General appearance: alert; no distress Abdomen: soft; non-distended; no tenderness; bowel sounds present; no masses or organomegaly; no guarding or rebound tenderness Rectum: hemorrhoid present that  I can manually reduce; minimal discomfort; no thrombosis or signs of infection; no rectal masses appreciated; no bleeding Back: no CVA tenderness; FROM at hips Extremities: no edema; symmetrical with no gross deformities Skin: warm and dry Neurologic: normal gait Psychological: alert and cooperative; normal mood and affect  No Known Allergies                                             Past Medical History:  Diagnosis Date  . Arthritis    fingers   . Cancer ([place])    ovarian cancer  . Depression   . Hyperlipidemia   . Hypertension    Social History   Socioeconomic History  . Marital status: Widowed    Spouse name: Not on file  . Number of children: Not on file  . Years of education: Not on file  . Highest education level: Not on file  Occupational History  . Not on file  Social Needs  . Financial resource strain: Not on file  . Food insecurity:    Worry: Not on file    Inability: Not on file  . Transportation needs:    Medical: Not on file    Non-medical: Not on file  Tobacco Use  . Smoking status: Never Smoker  . Smokeless tobacco: Never Used  Substance and Sexual Activity  . Alcohol use: No  . Drug use: No  . Sexual activity: Not on file  Lifestyle  . Physical activity:    Days per week: Not on file  Minutes per session: Not on file  . Stress: Not on file  Relationships  . Social connections:    Talks on phone: Not on file    Gets together: Not on file    Attends religious service: Not on file    Active member of club or organization: Not on file    Attends meetings of clubs or organizations: Not on file    Relationship status: Not on file  . Intimate partner violence:    Fear of current or ex partner: Not on file    Emotionally abused: Not on file    Physically abused: Not on file    Forced sexual activity: Not on file  Other Topics Concern  . Not on file  Social History Narrative  . Not on file   Family History  Problem Relation Age of Onset    . Heart disease Mother   . Heart disease Sister      Vanessa Kick, MD 10/01/17 [phone]

## 2018-06-08 ENCOUNTER — Encounter (HOSPITAL_COMMUNITY): Payer: Self-pay

## 2018-06-08 ENCOUNTER — Emergency Department (HOSPITAL_COMMUNITY)
Admission: EM | Admit: 2018-06-08 | Discharge: 2018-06-12 | Disposition: A | Discharge status: Other Acute Inpatient Hospital | Payer: Medicare Other | Attending: Emergency Medicine | Admitting: Emergency Medicine

## 2018-06-08 ENCOUNTER — Other Ambulatory Visit: Payer: Self-pay

## 2018-06-08 ENCOUNTER — Emergency Department (HOSPITAL_COMMUNITY): Payer: Medicare Other

## 2018-06-08 DIAGNOSIS — M25511 Pain in right shoulder: Secondary | ICD-10-CM | POA: Insufficient documentation

## 2018-06-08 DIAGNOSIS — Z8543 Personal history of malignant neoplasm of ovary: Secondary | ICD-10-CM | POA: Diagnosis not present

## 2018-06-08 DIAGNOSIS — I10 Essential (primary) hypertension: Secondary | ICD-10-CM | POA: Diagnosis not present

## 2018-06-08 DIAGNOSIS — E119 Type 2 diabetes mellitus without complications: Secondary | ICD-10-CM | POA: Insufficient documentation

## 2018-06-08 DIAGNOSIS — R4689 Other symptoms and signs involving appearance and behavior: Secondary | ICD-10-CM | POA: Diagnosis present

## 2018-06-08 DIAGNOSIS — F209 Schizophrenia, unspecified: Secondary | ICD-10-CM | POA: Diagnosis not present

## 2018-06-08 DIAGNOSIS — Z1159 Encounter for screening for other viral diseases: Secondary | ICD-10-CM | POA: Insufficient documentation

## 2018-06-08 DIAGNOSIS — Z91128 Patient's intentional underdosing of medication regimen for other reason: Secondary | ICD-10-CM | POA: Diagnosis not present

## 2018-06-08 DIAGNOSIS — T43596A Underdosing of other antipsychotics and neuroleptics, initial encounter: Secondary | ICD-10-CM | POA: Diagnosis not present

## 2018-06-08 DIAGNOSIS — R51 Headache: Secondary | ICD-10-CM | POA: Diagnosis not present

## 2018-06-08 DIAGNOSIS — Z046 Encounter for general psychiatric examination, requested by authority: Secondary | ICD-10-CM | POA: Insufficient documentation

## 2018-06-08 LAB — BASIC METABOLIC PANEL
Anion gap: 14 (ref 5–15)
BUN: 9 mg/dL (ref 8–23)
CO2: 24 mmol/L (ref 22–32)
Calcium: 9.6 mg/dL (ref 8.9–10.3)
Chloride: 102 mmol/L (ref 98–111)
Creatinine, Ser: 0.98 mg/dL (ref 0.44–1.00)
GFR calc Af Amer: >60 mL/min (ref >60)
GFR calc non Af Amer: 54 mL/min — ABNORMAL LOW (ref >60)
Glucose, Bld: 245 mg/dL — ABNORMAL HIGH (ref 70–99)
Potassium: 3.9 mmol/L (ref 3.5–5.1)
Sodium: 140 mmol/L (ref 135–145)

## 2018-06-08 LAB — CBC WITH DIFFERENTIAL/PLATELET
Abs Immature Granulocytes: 0.01 10*3/uL (ref 0.00–0.07)
Basophils Absolute: 0 10*3/uL (ref 0.0–0.1)
Basophils Relative: 0 %
Eosinophils Absolute: 0 10*3/uL (ref 0.0–0.5)
Eosinophils Relative: 1 %
HCT: 43.6 % (ref 36.0–46.0)
Hemoglobin: 13.9 g/dL (ref 12.0–15.0)
Immature Granulocytes: 0 %
Lymphocytes Relative: 33 %
Lymphs Abs: 1.7 10*3/uL (ref 0.7–4.0)
MCH: 29.6 pg (ref 26.0–34.0)
MCHC: 31.9 g/dL (ref 30.0–36.0)
MCV: 93 fL (ref 80.0–100.0)
Monocytes Absolute: 0.4 10*3/uL (ref 0.1–1.0)
Monocytes Relative: 9 %
Neutro Abs: 2.9 10*3/uL (ref 1.7–7.7)
Neutrophils Relative %: 57 %
Platelets: 240 10*3/uL (ref 150–400)
RBC: 4.69 MIL/uL (ref 3.87–5.11)
RDW: 12.8 % (ref 11.5–15.5)
WBC: 5.1 10*3/uL (ref 4.0–10.5)
nRBC: 0 % (ref 0.0–0.2)

## 2018-06-08 LAB — URINALYSIS, ROUTINE W REFLEX MICROSCOPIC
Bilirubin Urine: NEGATIVE
Glucose, UA: 150 mg/dL — AB
Hgb urine dipstick: NEGATIVE
Ketones, ur: NEGATIVE mg/dL
Nitrite: NEGATIVE
Protein, ur: NEGATIVE mg/dL
Specific Gravity, Urine: 1.012 (ref 1.005–1.030)
pH: 5 (ref 5.0–8.0)

## 2018-06-08 LAB — RAPID URINE DRUG SCREEN, HOSP PERFORMED
Amphetamines: NOT DETECTED
Barbiturates: NOT DETECTED
Benzodiazepines: NOT DETECTED
Cocaine: NOT DETECTED
Opiates: NOT DETECTED
Tetrahydrocannabinol: NOT DETECTED

## 2018-06-08 LAB — ETHANOL: Alcohol, Ethyl (B): <10 mg/dL (ref <10)

## 2018-06-08 MED ORDER — HALOPERIDOL LACTATE 5 MG/ML IJ SOLN
10.0000 mg | Freq: Once | INTRAMUSCULAR | Status: AC
  Administered 2018-06-08: 10 mg via INTRAMUSCULAR
  Filled 2018-06-08: qty 2

## 2018-06-08 MED ORDER — METFORMIN HCL 500 MG PO TABS: 250.0000 mg | ORAL_TABLET | Freq: Two times a day (BID) | ORAL | Status: DC

## 2018-06-08 MED ORDER — AMLODIPINE BESYLATE 5 MG PO TABS
2.5000 mg | ORAL_TABLET | Freq: Every day | ORAL | Status: DC
  Administered 2018-06-09 – 2018-06-11 (×3): 2.5 mg via ORAL
  Filled 2018-06-08 (×4): qty 1

## 2018-06-08 NOTE — ED Notes (Signed)
Pt unable to provide urine sample at this time 

## 2018-06-08 NOTE — ED Notes (Signed)
Patient transported to CT 

## 2018-06-08 NOTE — Care Management (Signed)
ED CM met with patient and son at bedside. Patient and son reports patient being off of her [place] medications and tried to get an appointment with [facility] but was unable to reach them, patient lives alone son is her from [place] seeking assistance to get patient stabilized back on her medication. Son voiced his displeasure with services at [facility]. Patient son is requesting information on a different  [place] services provider. CM discussed and recommended [facility] services for medication management and [facility] with Dr. Darleene Cleaver, patient and son are agreeable with transitional care planning offered choice [facility] selected.Referral faxed in to [facility]  ED evaluation still pending awaiting TTS.

## 2018-06-08 NOTE — ED Notes (Signed)
Pts son Michelle Macdonald [phone], taking pts valuables and keys home with him

## 2018-06-08 NOTE — ED Notes (Signed)
ED Provider at bedside. 

## 2018-06-08 NOTE — ED Triage Notes (Signed)
Pt bib son for evaluation of a fall that occurred 1.5 weeks ago, pt reports she felt faint in the kitchen while cooking and passed out in the floor. Injury noted to the back of her head that has now healed. Denies any blood thinners. Son also reports pt not taking any of her medications for schizophrenia, HTN and diabetes for the past 2 months. Son reports pts behavior and mood has become more erratic and pt hallucinating more. Pt calm and cooperative at this time.

## 2018-06-08 NOTE — ED Notes (Signed)
Pt getting anxious and agitated, wanting to leave, son still at bedside, Dr.Belfi at bedside.
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
## 2018-06-08 NOTE — ED Notes (Signed)
Urine culture sent with urine sample. 

## 2018-06-08 NOTE — ED Notes (Signed)
TTS in progress 

## 2018-06-08 NOTE — ED Provider Notes (Signed)
[facility] EMERGENCY DEPARTMENT Provider Note   CSN: [id number] Arrival date & time: 06/08/18  1509    History   Chief Complaint Chief Complaint  Patient presents with  . Fall  . Psychiatric Evaluation    HPI Michelle Macdonald is a 83 y.o. female.     Patient is a 83 year old female who presents for psychiatric evaluation.  She has a history of diabetes, hypertension, prior ovarian cancer and schizophrenia.  Her son brings her in because she stopped taking her Anette Guarneri a couple of weeks ago because she said it was giving her headaches.  Since then she stopped taking her other medications as well including her amlodipine and metformin.  Over the last week she has had worsening schizophrenia with bizarre behavior and anger outburst.  She has tirades where she talks about God.  She is making unusual accusations against her sister.  Her son thinks that this is all related to her schizophrenia.  She has not had any recent illnesses.  He does state that about a week and a half ago she had a syncopal episode and fell in the kitchen.  Since that time she has been acting okay other than she has had a little trouble with her balance.  No known fevers coughing or cold symptoms.  Every time I asked a question to the patient, she gets agitated and tells me to ask her son.     Past Medical History:  Diagnosis Date  . Arthritis    fingers   . Cancer ([place])    ovarian cancer  . Depression   . Hyperlipidemia   . Hypertension     Patient Active Problem List   Diagnosis Date Noted  . Malignant neoplasm of corpus uteri, except isthmus ([place]) 07/19/2013  . Endometrial cancer ([place]) 06/23/2013  . Postmenopausal bleeding 06/09/2013  . DIABETES MELLITUS, TYPE II 07/11/2006  . HYPERLIPIDEMIA 07/11/2006  . SCHIZOPHRENIA NOS, UNSPECIFIED 07/11/2006  . HYPERTENSION 07/11/2006  . DYSPEPSIA, HX OF 07/11/2006    Past Surgical History:  Procedure Laterality Date  . ROBOTIC ASSISTED  TOTAL HYSTERECTOMY WITH BILATERAL SALPINGO OOPHERECTOMY Bilateral 07/19/2013   Procedure: ROBOTIC ASSISTED TOTAL HYSTERECTOMY WITH BILATERAL SALPINGO OOPHORECTOMY and lymph node dissecton bilateral;  Surgeon: [name], MD;  Location: [facility];  Service: Gynecology;  Laterality: Bilateral;     OB History    Gravida  3   Para  3   Term  3   Preterm  0   AB  0   Living  1     SAB  0   TAB  0   Ectopic  0   Multiple  0   Live Births               Home Medications    Prior to Admission medications   Medication Sig Start Date End Date Taking? Authorizing Provider  acetaminophen (TYLENOL) 500 MG tablet Take 500 mg by mouth every 6 (six) hours as needed for mild pain.   Yes [provider]  amLODipine (NORVASC) 2.5 MG tablet Take 2.5 mg by mouth daily.    Yes [provider]  lurasidone (LATUDA) 20 MG TABS tablet Take 20 mg by mouth daily with breakfast.    Yes [provider]  metFORMIN (GLUCOPHAGE) 500 MG tablet Take 250-500 mg by mouth 2 (two) times daily with a meal. 500 mg in the morning and 250 mg at bedtime   Yes [provider]  [place]  01/19/15   [provider]    Family History Family History  Problem Relation Age of Onset  . Heart disease Mother   . Heart disease Sister     Social History Social History   Tobacco Use  . Smoking status: Never Smoker  . Smokeless tobacco: Never Used  Substance Use Topics  . Alcohol use: No  . Drug use: No     Allergies   Patient has no known allergies.   Review of Systems Review of Systems  Unable to perform ROS: Psychiatric disorder     Physical Exam Updated Vital Signs BP 128/74   Pulse 71   Temp 99.3 F (37.4 C) (Oral)   Resp (!) 21   SpO2 97%   Physical Exam Constitutional:      Appearance: She is well-developed.  HENT:     Head: Normocephalic and atraumatic.  Eyes:     Pupils: Pupils are equal, round, and reactive to light.   Neck:     Musculoskeletal: Normal range of motion and neck supple.  Cardiovascular:     Rate and Rhythm: Normal rate and regular rhythm.     Heart sounds: Normal heart sounds.  Pulmonary:     Effort: Pulmonary effort is normal. No respiratory distress.     Breath sounds: Normal breath sounds. No wheezing or rales.  Chest:     Chest wall: No tenderness.  Abdominal:     General: Bowel sounds are normal.     Palpations: Abdomen is soft.     Tenderness: There is no abdominal tenderness. There is no guarding or rebound.  Musculoskeletal: Normal range of motion.  Lymphadenopathy:     Cervical: No cervical adenopathy.  Skin:    General: Skin is warm and dry.     Findings: No rash.  Neurological:     Mental Status: She is alert and oriented to person, place, and time.  Psychiatric:        Mood and Affect: Affect is labile and inappropriate.        Speech: Speech is rapid and pressured.        Behavior: Behavior is agitated.        Thought Content: Thought content is paranoid.      ED Treatments / Results  Labs (all labs ordered are listed, but only abnormal results are displayed) Labs Reviewed  BASIC METABOLIC PANEL - Abnormal; Notable for the following components:      Result Value   Glucose, Bld 245 (*)    GFR calc non Af Amer 54 (*)    All other components within normal limits  URINALYSIS, ROUTINE W REFLEX MICROSCOPIC - Abnormal; Notable for the following components:   Glucose, UA 150 (*)    Leukocytes,Ua MODERATE (*)    Bacteria, UA FEW (*)    All other components within normal limits  URINE CULTURE  CBC WITH DIFFERENTIAL/PLATELET  RAPID URINE DRUG SCREEN, HOSP PERFORMED  ETHANOL    EKG EKG Interpretation  Date/Time:  Tuesday June 08 2018 15:33:58 EDT Ventricular Rate:  87 PR Interval:    QRS Duration: 72 QT Interval:  306 QTC Calculation: 368 R Axis:   75 Text Interpretation:  Sinus rhythm Borderline repolarization abnormality since last tracing no  significant change Confirmed by Malvin Johns [phone]) on 06/08/2018 4:40:34 PM   Radiology Ct Head Wo Contrast  Result Date: 06/08/2018 CLINICAL DATA:  83 year old female with prior fall EXAM: CT HEAD WITHOUT CONTRAST TECHNIQUE: Contiguous axial images were obtained from  the base of the skull through the vertex without intravenous contrast. COMPARISON:  05/06/2017 FINDINGS: Brain: No acute intracranial hemorrhage. No midline shift or mass effect. Gray-white differentiation maintained. Unremarkable appearance of the ventricular system. Vascular: Unremarkable. Skull: No acute fracture.  No aggressive bone lesion identified. Sinuses/Orbits: Unremarkable appearance of the orbits. Mastoid air cells clear. No middle ear effusion. No significant sinus disease. Other: None IMPRESSION: Negative for acute intracranial abnormality Electronically Signed   By: Corrie Mckusick D.O.   On: 06/08/2018 16:10    Procedures Procedures (including critical care time)  Medications Ordered in ED Medications - No data to display   Initial Impression / Assessment and Plan / ED Course  I have reviewed the triage vital signs and the nursing notes.  Pertinent labs & imaging results that were available during my care of the patient were reviewed by me and considered in my medical decision making (see chart for details).        Patient is a 83 year old female with a history of schizophrenia who has not been taking her medications.  She has acute psychosis.  Her labs are non-concerning.  Her urine has some white cells but otherwise is not consistent with infection.  It was sent for culture but at this point I want started on antibiotics.  She had a head CT that was negative for abnormality.  She is medically cleared.  She had a TTS evaluation.  Initially the therapist had recommended inpatient treatment but the son wanted to take the patient home.  Home health arrangements were set up and it was determined that she can have  close outpatient follow-up.  However then the patient became more agitated and it was decided by the son that she needed to stay.  I spoke with behavioral health again and they are working on bed placement.  She was IVC'd and given Haldol for sedation.  Final Clinical Impressions(s) / ED Diagnoses   Final diagnoses:  Schizophrenia, unspecified type [facility])    ED Discharge Orders    None       Malvin Johns, MD 06/08/18 2236

## 2018-06-08 NOTE — ED Notes (Signed)
Sitter at bedside.

## 2018-06-08 NOTE — BH Assessment (Addendum)
Tele Assessment Note   Patient Name: Michelle Macdonald MRN: [id number] Referring Physician: MD Tamera Punt  Location of Patient: [facility] Location of Provider: [place] Department  Michelle Macdonald is an 83 y.o. female.  The pt came in to get checked out after the pt fell a couple of weeks ago.  The pt's son stated he wanted the pt's mental health medications adjusted, since the pt isn't taking any of her medication.  The pt has had the diagnosis of schizophrenia since she was younger.  She and her son stated she was stable for 30 years on thorazine.  The pt was switched to [facility] and the pt started having headaches about 2 months ago.  The pt stopped all of her medication at that time.  Since being off the medicine, the pt has been more verbally aggressive.  The pt's son stated the pt usually talks in a calm manner compared to the constricted speech of the patient.  The pt goes to [facility] and had her last appointment around the end of January according to the pt.  The pt hasn't been inpatient in about 30 years.  The pt lives alone.  She denies SI, HI, legal issues, history of abuse and hallucinations.  She stated she isn't sleeping well and has a good appetite.The pt denies SA.  Pt is dressed in a hospital gown.. She is alert and oriented x4. Pt speaks in a constricted tone, at moderate volume and normal pace. Eye contact is good. Pt's mood is restless. The pt made random rigid movements during the assessment, such as an uppercut movement or randomly shaking her hands.. There is no indication Pt is currently responding to internal stimuli or experiencing delusional thought content.?Pt was cooperative throughout assessment.    Diagnosis: F20.9 Schizophrenia  Past Medical History:  Past Medical History:  Diagnosis Date  . Arthritis    fingers   . Cancer ([place])    ovarian cancer  . Depression   . Hyperlipidemia   . Hypertension     Past Surgical History:  Procedure Laterality Date   . ROBOTIC ASSISTED TOTAL HYSTERECTOMY WITH BILATERAL SALPINGO OOPHERECTOMY Bilateral 07/19/2013   Procedure: ROBOTIC ASSISTED TOTAL HYSTERECTOMY WITH BILATERAL SALPINGO OOPHORECTOMY and lymph node dissecton bilateral;  Surgeon: [name], MD;  Location: [facility];  Service: Gynecology;  Laterality: Bilateral;    Family History:  Family History  Problem Relation Age of Onset  . Heart disease Mother   . Heart disease Sister     Social History:  reports that she has never smoked. She has never used smokeless tobacco. She reports that she does not drink alcohol or use drugs.  Additional Social History:  Alcohol / Drug Use Pain Medications: See MAR Prescriptions: See MAR Over the Counter: See MAR History of alcohol / drug use?: No history of alcohol / drug abuse Longest period of sobriety (when/how long): NA  CIWA: CIWA-Ar BP: 128/74 Pulse Rate: 71 COWS:    Allergies: No Known Allergies  Home Medications: (Not in a hospital admission)   OB/GYN Status:  No LMP recorded. Patient is postmenopausal.  General Assessment Data Location of Assessment: [facility] ED TTS Assessment: In system Is this a Tele or Face-to-Face Assessment?: Face-to-Face Is this an Initial Assessment or a Re-assessment for this encounter?: Initial Assessment Patient Accompanied by:: Adult Permission Given to speak with another: Yes Name, Relationship and Phone Number: Cregg Barwick Language Other than English: No Living Arrangements: Other (Comment)(home) What gender do you identify as?:  Female Pregnancy Status: No Living Arrangements: Alone Can pt return to current living arrangement?: Yes Admission Status: Voluntary Is patient capable of signing voluntary admission?: Yes Referral Source: Self/Family/Friend Insurance type: Medicare     Crisis Care Plan Living Arrangements: Alone Legal Guardian: Other:(self) Name of Psychiatrist: Beverly Sessions Name of Therapist: [facility]  Education Status Is patient  currently in school?: No  Risk to self with the past 6 months Suicidal Ideation: No Has patient been a risk to self within the past 6 months prior to admission? : No Suicidal Intent: No Has patient had any suicidal intent within the past 6 months prior to admission? : No Is patient at risk for suicide?: No Suicidal Plan?: No Has patient had any suicidal plan within the past 6 months prior to admission? : No Access to Means: No What has been your use of drugs/alcohol within the last 12 months?: none Previous Attempts/Gestures: No How many times?: 0 Other Self Harm Risks: NA Triggers for Past Attempts: None known Intentional Self Injurious Behavior: None Family Suicide History: No Recent stressful life event(s): Other (Comment)(none mentioned) Persecutory voices/beliefs?: No Depression: No Suicide prevention information given to non-admitted patients: Not applicable  Risk to Others within the past 6 months Homicidal Ideation: No Does patient have any lifetime risk of violence toward others beyond the six months prior to admission? : No Thoughts of Harm to Others: No Current Homicidal Intent: No Current Homicidal Plan: No Access to Homicidal Means: No Identified Victim: NA History of harm to others?: No Assessment of Violence: None Noted Violent Behavior Description: none Does patient have access to weapons?: No Criminal Charges Pending?: No Does patient have a court date: No Is patient on probation?: No  Psychosis Hallucinations: None noted Delusions: Unspecified  Mental Status Report Appearance/Hygiene: Unremarkable, In scrubs Eye Contact: Fair Motor Activity: Freedom of movement, Rigidity Speech: Pressured Level of Consciousness: Restless Mood: Anxious Affect: Anxious Anxiety Level: Moderate Thought Processes: Coherent, Relevant Judgement: Partial Orientation: Appropriate for developmental age Obsessive Compulsive Thoughts/Behaviors: None  Cognitive  Functioning Concentration: Normal Memory: Recent Intact, Remote Intact Is patient IDD: No Insight: Fair Impulse Control: Fair Appetite: Fair Have you had any weight changes? : No Change Sleep: Decreased Total Hours of Sleep: 4 Vegetative Symptoms: None  ADLScreening [facility] Assessment Services) Patient's cognitive ability adequate to safely complete daily activities?: Yes Patient able to express need for assistance with ADLs?: Yes Independently performs ADLs?: Yes (appropriate for developmental age)  Prior Inpatient Therapy Prior Inpatient Therapy: Yes Prior Therapy Dates: 20 years ago Prior Therapy Facilty/Provider(s): unknown Reason for Treatment: psychosis  Prior Outpatient Therapy Prior Outpatient Therapy: Yes Prior Therapy Dates: current Prior Therapy Facilty/Provider(s): [facility] Reason for Treatment: schizophrenia Does patient have an ACCT team?: No Does patient have Intensive In-House Services?  : No Does patient have [facility] services? : No Does patient have P4CC services?: No  ADL Screening (condition at time of admission) Patient's cognitive ability adequate to safely complete daily activities?: Yes Patient able to express need for assistance with ADLs?: Yes Independently performs ADLs?: Yes (appropriate for developmental age)       Abuse/Neglect Assessment (Assessment to be complete while patient is alone) Abuse/Neglect Assessment Can Be Completed: Yes Physical Abuse: Denies Verbal Abuse: Denies Sexual Abuse: Denies Exploitation of patient/patient's resources: Denies Self-Neglect: Denies Values / Beliefs Cultural Requests During Hospitalization: None Spiritual Requests During Hospitalization: None Consults Spiritual Care Consult Needed: No Social Work Consult Needed: No Regulatory affairs officer (For Healthcare) Does Patient Have a Medical Advance Directive?: No  Would patient like information on creating a medical advance directive?: No - Patient declined           Disposition:  Disposition Initial Assessment Completed for this Encounter: Yes  PA Patriciaann Clan recommends inpatient treatment.  This service was provided via telemedicine using a 2-way, interactive audio and video technology.  Names of all persons participating in this telemedicine service and their role in this encounter. Name: Skiler Tye Role: Pt  Name: Elliot Dally Role: Pt's son  Name:  Role:   Name:  Role:     Enzo Montgomery 06/08/2018 7:59 PM

## 2018-06-09 DIAGNOSIS — F209 Schizophrenia, unspecified: Secondary | ICD-10-CM | POA: Diagnosis not present

## 2018-06-09 LAB — URINE CULTURE

## 2018-06-09 LAB — CBG MONITORING, ED: Glucose-Capillary: 203 mg/dL — ABNORMAL HIGH (ref 70–99)

## 2018-06-09 MED ORDER — METFORMIN HCL 500 MG PO TABS
250.0000 mg | ORAL_TABLET | Freq: Two times a day (BID) | ORAL | Status: DC
  Administered 2018-06-09 – 2018-06-12 (×7): 250 mg via ORAL
  Filled 2018-06-09 (×8): qty 1

## 2018-06-09 MED ORDER — ACETAMINOPHEN 500 MG PO TABS
500.0000 mg | ORAL_TABLET | Freq: Once | ORAL | Status: AC
  Administered 2018-06-09: 12:00:00 500 mg via ORAL
  Filled 2018-06-09: qty 1

## 2018-06-09 NOTE — ED Notes (Signed)
Notifed Lenn Sink that a specific amount of glucophage is needed instead of a range.

## 2018-06-09 NOTE — ED Notes (Signed)
Sitter helped her to shower this am. She is calm but continues to talk about people being different races and love and all of [place] to get along, this is the theme of what she talks about.

## 2018-06-09 NOTE — ED Notes (Signed)
Pt yells, "There is a God".

## 2018-06-09 NOTE — ED Notes (Addendum)
Pt has complained of some shoulder discomfort. Notified ED-PA-C

## 2018-06-09 NOTE — ED Notes (Signed)
Pt sleeping at the moment. Pt sitter outside of pt room. Will assess pt at a later time. Pt safe will continue to monitor.

## 2018-06-09 NOTE — BHH Counselor (Signed)
Pt was reassessed this AM.  She was resting on her bed. She stated that she stopped taking her medication Anette Guarneri) because it was not helping, but that she would take medication at the hospital because it was ''from God.''  Pt was emphatic that she she goes to [facility], but [facility].  Pt was circumstantial in though processes.  She would interrupt author's questions with statements such as ''Sir!  Do you understand.''  Recommend continued inpatient.   From assessment: 83 y.o. female.  The pt came in to get checked out after the pt fell a couple of weeks ago.  The pt's son stated he wanted the pt's mental health medications adjusted, since the pt isn't taking any of her medication.  The pt has had the diagnosis of schizophrenia since she was younger.  She and her son stated she was stable for 30 years on thorazine.  The pt was switched to [facility] and the pt started having headaches about 2 months ago.  The pt stopped all of her medication at that time.  Since being off the medicine, the pt has been more verbally aggressive.  The pt's son stated the pt usually talks in a calm manner compared to the constricted speech of the patient.  The pt goes to [facility] and had her last appointment around the end of January according to the pt.  The pt hasn't been inpatient in about 30 years.

## 2018-06-09 NOTE — ED Notes (Signed)
Pt's son shared that she stopped taking her Latuda about a month ago because it causes headaches.

## 2018-06-09 NOTE — ED Notes (Signed)
Breakfast ordered 

## 2018-06-09 NOTE — Progress Notes (Signed)
Pt. meets criteria for inpatient treatment per Patriciaann Clan, PA.  No appropriate beds available at [facility]. Referred out to the following hospitals:  [facility]     [facility]     [facility]    Disposition CSW will continue to follow for placement.  Areatha Keas. Judi Cong, MSW, [place] Disposition Clinical Social Work [phone] (cell) [phone] (office)

## 2018-06-09 NOTE — ED Notes (Signed)
Has slept most of shift with sitter at bedside. She has periodically talked out in her sleep. Some confusion noted by sitter. She has not been aggressive. Up once to use the restroom. Will continue to monitor for safety and sitter at bedside.

## 2018-06-09 NOTE — ED Notes (Signed)
Pt signed consent for her son Cregg to receive information. Cregg shared that he has his mother's purse, keys and eye glasses.  She was concerned about their location.

## 2018-06-09 NOTE — ED Notes (Signed)
Alert to self and place, not time. Denies SI/HI/AVH. Uses the bathroom independently. Continues to say unrelated things and yells out occasionally.

## 2018-06-09 NOTE — Progress Notes (Signed)
Inpatient Diabetes Program Recommendations  AACE/ADA: New Consensus Statement on Inpatient Glycemic Control (2015)  Target Ranges:  Prepandial:   less than 140 mg/dL      Peak postprandial:   less than 180 mg/dL (1-2 hours)      Critically ill patients:  140 - 180 mg/dL   Lab Results  Component Value Date   GLUCAP 161 (H) 07/21/2013    Review of Glycemic Control Results for Michelle Macdonald, Michelle Macdonald (MRN [id number]) as of 06/09/2018 13:31  Ref. Range 06/08/2018 16:18  Glucose Latest Ref Range: 70 - 99 mg/dL 245 (H)   Diabetes history: Type 2 DM Outpatient Diabetes medications: Metformin 250 mg BID Current orders for Inpatient glycemic control: Metformin 250 mg BID  Inpatient Diabetes Program Recommendations:    Noted patient waiting for inpatient psych placement.   While waiting, consider adding Novolog 0-9 units TID & HS and add CBGs for FSBG, TID & HS.  Also of note, patient does not have documented A1C. If appropriate consider A1C.  Thanks, Bronson Curb, MSN, RNC-OB Diabetes Coordinator [phone] (8a-5p)

## 2018-06-09 NOTE — ED Notes (Signed)
Pt's son said that she has a history of dislocating her right shoulder while lying in bed.He asked that Latuda be listed as an allergy to ensure that it is not given to her.  She believes that it gives her a headache.

## 2018-06-09 NOTE — ED Notes (Signed)
Pt talks to herself and is disorganized.  Sometimes she yells out, but calms back down. Stays in her bed with sitter present.

## 2018-06-10 DIAGNOSIS — F209 Schizophrenia, unspecified: Secondary | ICD-10-CM | POA: Diagnosis not present

## 2018-06-10 LAB — URINALYSIS, ROUTINE W REFLEX MICROSCOPIC
Bilirubin Urine: NEGATIVE
Glucose, UA: NEGATIVE mg/dL
Ketones, ur: NEGATIVE mg/dL
Nitrite: NEGATIVE
Protein, ur: NEGATIVE mg/dL
Specific Gravity, Urine: 1.017 (ref 1.005–1.030)
pH: 5 (ref 5.0–8.0)

## 2018-06-10 LAB — CBG MONITORING, ED
Glucose-Capillary: 152 mg/dL — ABNORMAL HIGH (ref 70–99)
Glucose-Capillary: 199 mg/dL — ABNORMAL HIGH (ref 70–99)
Glucose-Capillary: 190 mg/dL — ABNORMAL HIGH (ref 70–99)

## 2018-06-10 NOTE — ED Notes (Signed)
Pt did not want to eat her dinner.

## 2018-06-10 NOTE — ED Provider Notes (Signed)
Psychiatric Default Provider Note:   Patient is a 83 year old female with past medical history of diabetes, hypertension, schizophrenia, and prior ovarian cancer presenting for erratic behavior.  She was IVC by her son after she stopped taking all of her medications including her [place].  She reported that the Latuda made her have a headache.  On evaluation this morning, patient appears slightly disorganized as she repeats several questions, but is oriented to person and place.  She is not responding to internal stimuli.  Lab work reviewed, patient had moderate leukocytes in her initial urinalysis.  Urine was cultured and showed multiple species with recommending recollection.  We will perform this today.  She has not had any fevers or vomiting.  Vital signs reviewed over the past 24 hours and are stable as below.  She is back on antihypertensives and blood pressure readings are normalizing.  Vitals:   06/09/18 1759 06/10/18 0530  BP: (!) 171/82 129/74  Pulse: 89 73  Resp: 18 18  Temp: 98.1 F (36.7 C) 98.3 F (36.8 C)  SpO2: 92% 93%   Patient meets inpatient criteria and is currently awaiting Geri psych placement.     Albesa Seen, PA-C 06/10/18 6811    Elnora Morrison, MD 06/10/18 [phone]

## 2018-06-10 NOTE — ED Notes (Signed)
Pt confused. Flight of ideas. Labile. Irritable. Paranoid. Suspicious. Pt wants to go home. Pt has limited insight. Uncooperative , redirected.   Hyperreligious at times.

## 2018-06-10 NOTE — ED Notes (Signed)
Vitals and CBG taken at bedside, pt very pleasant and cooperative.

## 2018-06-10 NOTE — Progress Notes (Signed)
Received Michelle Macdonald in her room awake with her sitter at the bedside. Her only concern is wanting to go home. She is cooperative and calm. Her BS tonight was 190 and she was compliant with her medication. She went to sleep at 0215 hrs and slept throughout except for a couple of visits to the bathroom.

## 2018-06-10 NOTE — BH Assessment (Signed)
[place] Assessment Progress Note    Patient presents somewhat agitated today stating that she wants to go home.  Previous notes ny nursing state that she has been agitated and confused and talking out of her head at times.  Pt denies any current SI, HI or psychosis.  She presents as alert and oriented.  Patient states that her sleep was good last night and she states that she has been eating well.  Patient appears to be having a lucid moment suring the time she was assessed.  However, it appears that things can quickly and she can become very confused and agitated.  TTS and Social Work to continue to seek placement based on Dr Ronnie Derby recommendation.

## 2018-06-11 DIAGNOSIS — F209 Schizophrenia, unspecified: Secondary | ICD-10-CM | POA: Diagnosis not present

## 2018-06-11 LAB — CBG MONITORING, ED
Glucose-Capillary: 205 mg/dL — ABNORMAL HIGH (ref 70–99)
Glucose-Capillary: 168 mg/dL — ABNORMAL HIGH (ref 70–99)
Glucose-Capillary: 186 mg/dL — ABNORMAL HIGH (ref 70–99)

## 2018-06-11 LAB — URINE CULTURE: Culture: <10000 — AB

## 2018-06-11 MED ORDER — CHLORPROMAZINE HCL 25 MG PO TABS
50.0000 mg | ORAL_TABLET | Freq: Two times a day (BID) | ORAL | Status: DC
  Administered 2018-06-11 – 2018-06-12 (×3): 50 mg via ORAL
  Filled 2018-06-11 (×3): qty 2

## 2018-06-11 NOTE — Progress Notes (Signed)
I was requested to review patient's chart.  I have consulted with Dr. Dwyane Dee.  Chart reports that patient was stable on Thorazine for 30+ years and was recently switched to [facility] and was having complications and patient discontinued medication on her own.  Discussed patient with Dr. Dwyane Dee and will restart Thorazine at 50 mg p.o. twice daily.  TTS staff to do reassessment outpatient.

## 2018-06-11 NOTE — ED Notes (Signed)
Pt has been yelling out this morning, hype-rreligious, speaking loudly to staff and making demands. Requires redirection and only responds from stern redirection.

## 2018-06-11 NOTE — ED Notes (Signed)
Pt took the Thorazine. She said that she stopped taking it because, "They stopped making it."  Currently she is calm and cooperative.

## 2018-06-11 NOTE — ED Notes (Signed)
Pt alert with periods of confusion. Pt denies any pain or discomfort. Pt denies any SI, HI, and AVH at this time. Pt calm and cooperative. Pt sitter at the bedside. Pt safe, will continue to monitor.

## 2018-06-11 NOTE — BH Assessment (Addendum)
Reassessment note: Pt presents sitting up in hospital bed. Her affect is constricted. Pt is generally cooperative & mildly irritable at times. Pt denies SI, HI & sx of psychosis. She reports dizzy/ numb feeling & states she suspects it is due to restarting Thorazine. Case staffed with Marvia Pickles, NP.  Inpt tx continues to be recommended.

## 2018-06-11 NOTE — Progress Notes (Signed)
Pt continues to meet inpatient criteria per Marvia Pickles, NP. Referral information has been re-sent to the following hospitals for review:   [facility]  [facility]  [facility]   Disposition will continue to assist with inpatient placement needs.   Audree Camel, LCSW, [place] Disposition [facility] BHH/TTS [phone]

## 2018-06-11 NOTE — ED Notes (Signed)
Faxed IVC papers to [facility].

## 2018-06-11 NOTE — Progress Notes (Signed)
CSW present on unit whenever patient was being verbally abusive to her current sitter. CSW witnessed patient yelling cuss words and using inappropriate language towards staff. Patient appears to be very agitated. Diane, RN and [employer] officer also present monitoring the situation.  Madilyn Fireman, MSW, [place] Clinical Social Worker Emergency Department [employer] [phone]

## 2018-06-11 NOTE — Progress Notes (Addendum)
Pt accepted to [facility] in [place], [place]. Dr. Rodman Key is the accepting/attending provider.   Call report to [phone] (ask to be transferred to gero unit) Pt is involuntary and will be transported by law enforcement Pt is scheduled to arrive at [facility] at 2:30pm on 06/12/18 IVC paperwork should be faxed to [facility] at [place], [place], [place] Disposition CSW [facility] BHH/TTS [phone]  Update 6:20pm: CSW has called pt's son, Cregg, at [phone], but there is no answer and the option to leave a voice message is not available. CSW will continue to attempt to notify pt's son of her disposition.   10:45pm: CSW has been unable to reach pt's son Cregg. Robin @[facility]  ED has been notified and will tell pt's 1st shift RN.

## 2018-06-11 NOTE — ED Provider Notes (Addendum)
RN notes, vitals, and pt's labs reviewed. Pending geri-psych placement. Per notes, pt appears to become easily agitated, but is calm during my assessment. Will continue to monitor.   Per RN, pt has periods of becoming very aggressive. If she becomes increasing agitated, may need another dose of haldol (EKG reviewed, qt stable).    Franchot Heidelberg, PA-C 06/11/18 3646  1000: Discussed with Darnelle Maffucci from [facility], who is starting thorazine 50 mg BID. Will need to monitor BGL closely, cbg checks ordered q6hr. Per Darnelle Maffucci, if pt becomes aggressive or is not taking thorazine, can give thorazine IM 25 prn agitation.      Franchot Heidelberg, PA-C 06/11/18 1001    Jola Schmidt, MD 06/11/18 2201
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
## 2018-06-12 ENCOUNTER — Other Ambulatory Visit: Payer: Self-pay

## 2018-06-12 DIAGNOSIS — F209 Schizophrenia, unspecified: Secondary | ICD-10-CM | POA: Diagnosis not present

## 2018-06-12 LAB — SARS CORONAVIRUS 2 BY RT PCR (HOSPITAL ORDER, PERFORMED IN ~~LOC~~ HOSPITAL LAB): SARS Coronavirus 2: NEGATIVE

## 2018-06-12 MED ORDER — ACETAMINOPHEN 500 MG PO TABS
1000.0000 mg | ORAL_TABLET | Freq: Once | ORAL | Status: AC
  Administered 2018-06-12: 1000 mg via ORAL
  Filled 2018-06-12: qty 2

## 2018-06-12 NOTE — ED Notes (Signed)
Pt's son, Cecilie Lowers, called and he advised [place] called him to advise pt has been accepted and will be transported to their facility today. Son voiced agreement w/tx plan.

## 2018-06-12 NOTE — ED Notes (Signed)
Pt in shower.  

## 2018-06-12 NOTE — BH Assessment (Addendum)
[place] Assessment Progress Note Patient was noted to be sleeping as this Probation officer attempted to evaluate current mental health status. Patient is observed to be drowsy and seem at times, not to process the content of this writer's questions. Patient repeats "no, no, no" to this writer's questions associated with S/I and H/I. Patient is observed to be irritable and is asking this writer "not to bother her." Patient will not respond to any further questions at this time. Patient continues to meet inpatient criteria and has been accepted to [facility] per nurse. Per CSW: Pt accepted to [facility] in [place], [place]. Dr. Rodman Key is the accepting/attending provider.   Call report to [phone] (ask to be transferred to gero unit) Pt is involuntary and will be transported by law enforcement Pt is scheduled to arrive at [facility] at 2:30pm on 06/12/18 IVC paperwork should be faxed to [facility] at [phone]
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
## 2018-06-12 NOTE — ED Provider Notes (Signed)
9:51 AM  BP (!) 97/52 (BP Location: Left Arm)   Pulse 90   Temp 98.3 F (36.8 C) (Oral)   Resp 16   SpO2 97%   Michelle Macdonald  is here for Agitation , hyper-religiosity. Hx of schizophrenia, HTN.  Under IVC.  O- Patient in the shower  A- Schizophrenia and agitation  P- accepted for placement at [facility], [place], [place] 06/12/18 [place], [place], MD 06/13/18 [phone]

## 2018-06-12 NOTE — ED Notes (Signed)
Pt given Tylenol as ordered by Dr Johnney Killian. Pt then escorted via w/c to Deputy's vehicle so may be transported to [facility]. ALL belongings - 1 labeled belongings bag - Deputy. Pt under IVC.

## 2018-06-12 NOTE — ED Notes (Signed)
Pt noted to be cursing - states she wants to walk around ED. Advised pt she is unable to do so. Pt took meds after encouragement.

## 2018-06-12 NOTE — Progress Notes (Signed)
09:50: CSW has called pt's son, Cregg, at [phone], but there is no answer and the option to leave a voice message is not available. CSW will continue to attempt to notify pt's son of her disposition.   Chalmers Guest. Guerry Bruin, MSW, [facility] Work/Disposition Phone: [phone] Fax: [phone]

## 2018-06-12 NOTE — ED Notes (Signed)
Deputy aware of need for transport.

## 2019-07-26 ENCOUNTER — Encounter: Payer: Self-pay | Admitting: Physician Assistant

## 2019-08-01 ENCOUNTER — Encounter: Payer: Self-pay | Admitting: Internal Medicine

## 2019-08-30 ENCOUNTER — Ambulatory Visit: Payer: Medicare Other | Admitting: Physician Assistant

## 2020-06-23 ENCOUNTER — Ambulatory Visit (HOSPITAL_COMMUNITY)
Admission: EM | Admit: 2020-06-23 | Discharge: 2020-06-23 | Disposition: A | Discharge status: Home / Self Care | Payer: Medicare Other | Attending: Family Medicine | Admitting: Family Medicine

## 2020-06-23 ENCOUNTER — Encounter (HOSPITAL_COMMUNITY): Payer: Self-pay

## 2020-06-23 ENCOUNTER — Other Ambulatory Visit: Payer: Self-pay

## 2020-06-23 DIAGNOSIS — K0889 Other specified disorders of teeth and supporting structures: Secondary | ICD-10-CM

## 2020-06-23 MED ORDER — LIDOCAINE VISCOUS HCL 2 % MT SOLN: 10.0000 mL | OROMUCOSAL | 0 refills | Status: AC | PRN

## 2020-06-23 MED ORDER — AMOXICILLIN-POT CLAVULANATE 875-125 MG PO TABS: 1.0000 | ORAL_TABLET | Freq: Two times a day (BID) | ORAL | 0 refills | Status: DC

## 2020-06-23 NOTE — ED Triage Notes (Signed)
Pt in with c/o left tooth pain x 2 days  Pt has been taking tylenol with minimal relief  No facial swelling noted

## 2020-06-23 NOTE — Discharge Instructions (Addendum)
Follow-up with your dentist first thing next week

## 2020-06-23 NOTE — ED Provider Notes (Signed)
[facility]    CSN: [id number] Arrival date & time: 06/23/20  1033      History   Chief Complaint Chief Complaint  Patient presents with  . Dental Pain    HPI Michelle Macdonald is a 85 y.o. female.   Patient presenting today with 2-day history of worsening dental pain left upper posterior molar.  States she feels like the tooth has a chip in it but she does not recall injuring it.  Denies fever, chills, drainage from the area, headache, dysphagia.  Has been using warm salt water gargles and Tylenol with minimal relief.  Cannot see her dentist until next week.      Past Medical History:  Diagnosis Date  . Arthritis    fingers   . Cancer ([place])    ovarian cancer  . Depression   . Hyperlipidemia   . Hypertension     Patient Active Problem List   Diagnosis Date Noted  . Malignant neoplasm of corpus uteri, except isthmus ([place]) 07/19/2013  . Endometrial cancer ([place]) 06/23/2013  . Postmenopausal bleeding 06/09/2013  . DIABETES MELLITUS, TYPE II 07/11/2006  . HYPERLIPIDEMIA 07/11/2006  . SCHIZOPHRENIA NOS, UNSPECIFIED 07/11/2006  . HYPERTENSION 07/11/2006  . DYSPEPSIA, HX OF 07/11/2006    Past Surgical History:  Procedure Laterality Date  . ROBOTIC ASSISTED TOTAL HYSTERECTOMY WITH BILATERAL SALPINGO OOPHERECTOMY Bilateral 07/19/2013   Procedure: ROBOTIC ASSISTED TOTAL HYSTERECTOMY WITH BILATERAL SALPINGO OOPHORECTOMY and lymph node dissecton bilateral;  Surgeon: [name], MD;  Location: [facility];  Service: Gynecology;  Laterality: Bilateral;    OB History    Gravida  3   Para  3   Term  3   Preterm  0   AB  0   Living  1     SAB  0   IAB  0   Ectopic  0   Multiple  0   Live Births               Home Medications    Prior to Admission medications   Medication Sig Start Date End Date Taking? Authorizing Provider  amoxicillin-clavulanate (AUGMENTIN) 875-125 MG tablet Take 1 tablet by mouth every 12 (twelve) hours. 06/23/20  Yes  Volney American, PA-C  lidocaine (XYLOCAINE) 2 % solution Use as directed 10 mLs in the mouth or throat as needed for mouth pain. 06/23/20  Yes Volney American, PA-C  acetaminophen (TYLENOL) 500 MG tablet Take 500 mg by mouth every 6 (six) hours as needed for mild pain.    [provider]  amLODipine (NORVASC) 2.5 MG tablet Take 2.5 mg by mouth daily.     [provider]  lurasidone (LATUDA) 20 MG TABS tablet Take 20 mg by mouth daily with breakfast.     [provider]  metFORMIN (GLUCOPHAGE) 500 MG tablet Take 250-500 mg by mouth 2 (two) times daily with a meal. 500 mg in the morning and 250 mg at bedtime    [provider]  [place]  01/19/15   [provider]    Family History Family History  Problem Relation Age of Onset  . Heart disease Mother   . Heart disease Sister     Social History Social History   Tobacco Use  . Smoking status: Never Smoker  . Smokeless tobacco: Never Used  Substance Use Topics  . Alcohol use: No  . Drug use: No     Allergies   Latuda [lurasidone hcl]  Review of Systems Review of Systems Per HPI  Physical Exam Triage Vital Signs ED Triage Vitals  Enc Vitals Group     BP 06/23/20 1123 (!) 143/77     Pulse Rate 06/23/20 1123 91     Resp 06/23/20 1123 17     Temp 06/23/20 1123 99.8 F (37.7 C)     Temp Source 06/23/20 1123 Oral     SpO2 06/23/20 1123 98 %     Weight --      Height --      Head Circumference --      Peak Flow --      Pain Score 06/23/20 1121 10     Pain Loc --      Pain Edu? --      Excl. in [place]? --    No data found.  Updated Vital Signs BP (!) 143/77 (BP Location: Right Arm)   Pulse 91   Temp 99.8 F (37.7 C) (Oral)   Resp 17   SpO2 98%   Visual Acuity Right Eye Distance:   Left Eye Distance:   Bilateral Distance:    Right Eye Near:   Left Eye Near:    Bilateral Near:     Physical Exam Vitals and nursing note reviewed.   Constitutional:      Appearance: Normal appearance. She is not ill-appearing.  HENT:     Head: Atraumatic.     Right Ear: Tympanic membrane normal.     Left Ear: Tympanic membrane normal.     Nose: Rhinorrhea present.     Mouth/Throat:     Mouth: Mucous membranes are moist.     Pharynx: Oropharynx is clear. Posterior oropharyngeal erythema present.     Comments: Gingival erythema left upper posterior molar Eyes:     Extraocular Movements: Extraocular movements intact.     Conjunctiva/sclera: Conjunctivae normal.  Cardiovascular:     Rate and Rhythm: Normal rate and regular rhythm.     Heart sounds: Normal heart sounds.  Pulmonary:     Effort: Pulmonary effort is normal.     Breath sounds: Normal breath sounds.  Musculoskeletal:        General: Normal range of motion.     Cervical back: Normal range of motion and neck supple.  Skin:    General: Skin is warm and dry.  Neurological:     Mental Status: She is alert and oriented to person, place, and time.  Psychiatric:        Mood and Affect: Mood normal.        Thought Content: Thought content normal.        Judgment: Judgment normal.      UC Treatments / Results  Labs (all labs ordered are listed, but only abnormal results are displayed) Labs Reviewed - No data to display  EKG   Radiology No results found.  Procedures Procedures (including critical care time)  Medications Ordered in UC Medications - No data to display  Initial Impression / Assessment and Plan / UC Course  I have reviewed the triage vital signs and the nursing notes.  Pertinent labs & imaging results that were available during my care of the patient were reviewed by me and considered in my medical decision making (see chart for details).     Will treat with Augmentin, viscous lidocaine, over-the-counter pain relievers, salt water gargles until she can see her dentist next week.  Follow-up with worsening symptoms in the meantime.  Final  Clinical Impressions(s) /  UC Diagnoses   Final diagnoses:  Pain, dental     Discharge Instructions     Follow-up with your dentist first thing next week    ED Prescriptions    Medication Sig Dispense Auth. Provider   amoxicillin-clavulanate (AUGMENTIN) 875-125 MG tablet Take 1 tablet by mouth every 12 (twelve) hours. 14 tablet Volney American, [place]   lidocaine (XYLOCAINE) 2 % solution Use as directed 10 mLs in the mouth or throat as needed for mouth pain. 100 mL Volney American, [place]     PDMP not reviewed this encounter.   Volney American, [place] 06/23/20 1159

## 2020-09-06 ENCOUNTER — Other Ambulatory Visit: Payer: Self-pay | Admitting: Internal Medicine

## 2020-09-07 LAB — COMPLETE METABOLIC PANEL WITH GFR
AG Ratio: 1.4 (calc) (ref 1.0–2.5)
ALT: 9 U/L (ref 6–29)
AST: 17 U/L (ref 10–35)
Albumin: 4.1 g/dL (ref 3.6–5.1)
Alkaline phosphatase (APISO): 65 U/L (ref 37–153)
BUN: 9 mg/dL (ref 7–25)
CO2: 27 mmol/L (ref 20–32)
Calcium: 9.5 mg/dL (ref 8.6–10.4)
Chloride: 103 mmol/L (ref 98–110)
Creat: 0.77 mg/dL (ref 0.60–0.95)
Globulin: 2.9 g/dL (calc) (ref 1.9–3.7)
Glucose, Bld: 135 mg/dL — ABNORMAL HIGH (ref 65–99)
Potassium: 3.9 mmol/L (ref 3.5–5.3)
Sodium: 141 mmol/L (ref 135–146)
Total Bilirubin: 0.3 mg/dL (ref 0.2–1.2)
Total Protein: 7 g/dL (ref 6.1–8.1)
eGFR: 76 mL/min/{1.73_m2} (ref >=60)

## 2020-09-07 LAB — TSH: TSH: 1.2 mIU/L (ref 0.40–4.50)

## 2020-09-07 LAB — LIPID PANEL
Cholesterol: 227 mg/dL — ABNORMAL HIGH (ref <200)
HDL: 53 mg/dL (ref >=50)
LDL Cholesterol (Calc): 138 mg/dL (calc) — ABNORMAL HIGH
Non-HDL Cholesterol (Calc): 174 mg/dL (calc) — ABNORMAL HIGH (ref <130)
Total CHOL/HDL Ratio: 4.3 (calc) (ref <5.0)
Triglycerides: 226 mg/dL — ABNORMAL HIGH (ref <150)

## 2020-09-07 LAB — CBC
HCT: 40.6 % (ref 35.0–45.0)
Hemoglobin: 13.9 g/dL (ref 11.7–15.5)
MCH: 31.9 pg (ref 27.0–33.0)
MCHC: 34.2 g/dL (ref 32.0–36.0)
MCV: 93.1 fL (ref 80.0–100.0)
MPV: 10.3 fL (ref 7.5–12.5)
Platelets: 256 10*3/uL (ref 140–400)
RBC: 4.36 10*6/uL (ref 3.80–5.10)
RDW: 13.1 % (ref 11.0–15.0)
WBC: 4 10*3/uL (ref 3.8–10.8)

## 2020-10-07 ENCOUNTER — Emergency Department (HOSPITAL_COMMUNITY)
Admission: EM | Admit: 2020-10-07 | Discharge: 2020-10-07 | Disposition: A | Discharge status: Home / Self Care | Payer: Medicare Other | Attending: Emergency Medicine | Admitting: Emergency Medicine

## 2020-10-07 ENCOUNTER — Other Ambulatory Visit: Payer: Self-pay

## 2020-10-07 ENCOUNTER — Encounter (HOSPITAL_COMMUNITY): Payer: Self-pay

## 2020-10-07 DIAGNOSIS — Z8543 Personal history of malignant neoplasm of ovary: Secondary | ICD-10-CM | POA: Insufficient documentation

## 2020-10-07 DIAGNOSIS — N3001 Acute cystitis with hematuria: Secondary | ICD-10-CM

## 2020-10-07 DIAGNOSIS — Z79899 Other long term (current) drug therapy: Secondary | ICD-10-CM | POA: Insufficient documentation

## 2020-10-07 DIAGNOSIS — E119 Type 2 diabetes mellitus without complications: Secondary | ICD-10-CM | POA: Insufficient documentation

## 2020-10-07 DIAGNOSIS — Z8541 Personal history of malignant neoplasm of cervix uteri: Secondary | ICD-10-CM | POA: Diagnosis not present

## 2020-10-07 DIAGNOSIS — Z7984 Long term (current) use of oral hypoglycemic drugs: Secondary | ICD-10-CM | POA: Insufficient documentation

## 2020-10-07 DIAGNOSIS — R319 Hematuria, unspecified: Secondary | ICD-10-CM | POA: Insufficient documentation

## 2020-10-07 DIAGNOSIS — R309 Painful micturition, unspecified: Secondary | ICD-10-CM | POA: Diagnosis not present

## 2020-10-07 DIAGNOSIS — Z8542 Personal history of malignant neoplasm of other parts of uterus: Secondary | ICD-10-CM | POA: Insufficient documentation

## 2020-10-07 DIAGNOSIS — I10 Essential (primary) hypertension: Secondary | ICD-10-CM | POA: Diagnosis not present

## 2020-10-07 LAB — BASIC METABOLIC PANEL
Anion gap: 7 (ref 5–15)
BUN: 14 mg/dL (ref 8–23)
CO2: 28 mmol/L (ref 22–32)
Calcium: 9.4 mg/dL (ref 8.9–10.3)
Chloride: 106 mmol/L (ref 98–111)
Creatinine, Ser: 1.01 mg/dL — ABNORMAL HIGH (ref 0.44–1.00)
GFR, Estimated: 55 mL/min — ABNORMAL LOW (ref >60)
Glucose, Bld: 215 mg/dL — ABNORMAL HIGH (ref 70–99)
Potassium: 3.8 mmol/L (ref 3.5–5.1)
Sodium: 141 mmol/L (ref 135–145)

## 2020-10-07 LAB — URINALYSIS, ROUTINE W REFLEX MICROSCOPIC
Bilirubin Urine: NEGATIVE
Glucose, UA: 50 mg/dL — AB
Ketones, ur: NEGATIVE mg/dL
Nitrite: NEGATIVE
Protein, ur: 100 mg/dL — AB
RBC / HPF: >50 RBC/hpf — ABNORMAL HIGH (ref 0–5)
Specific Gravity, Urine: 1.011 (ref 1.005–1.030)
WBC, UA: >50 WBC/hpf — ABNORMAL HIGH (ref 0–5)
pH: 5 (ref 5.0–8.0)

## 2020-10-07 LAB — CBC WITH DIFFERENTIAL/PLATELET
Abs Immature Granulocytes: 0.01 10*3/uL (ref 0.00–0.07)
Basophils Absolute: 0 10*3/uL (ref 0.0–0.1)
Basophils Relative: 0 %
Eosinophils Absolute: 0 10*3/uL (ref 0.0–0.5)
Eosinophils Relative: 0 %
HCT: 41.8 % (ref 36.0–46.0)
Hemoglobin: 13.6 g/dL (ref 12.0–15.0)
Immature Granulocytes: 0 %
Lymphocytes Relative: 10 %
Lymphs Abs: 0.8 10*3/uL (ref 0.7–4.0)
MCH: 30.7 pg (ref 26.0–34.0)
MCHC: 32.5 g/dL (ref 30.0–36.0)
MCV: 94.4 fL (ref 80.0–100.0)
Monocytes Absolute: 0.7 10*3/uL (ref 0.1–1.0)
Monocytes Relative: 8 %
Neutro Abs: 6.8 10*3/uL (ref 1.7–7.7)
Neutrophils Relative %: 82 %
Platelets: 215 10*3/uL (ref 150–400)
RBC: 4.43 MIL/uL (ref 3.87–5.11)
RDW: 13.4 % (ref 11.5–15.5)
WBC: 8.3 10*3/uL (ref 4.0–10.5)
nRBC: 0 % (ref 0.0–0.2)

## 2020-10-07 MED ORDER — PHENAZOPYRIDINE HCL 200 MG PO TABS: 200.0000 mg | ORAL_TABLET | Freq: Three times a day (TID) | ORAL | 0 refills | Status: DC | PRN

## 2020-10-07 MED ORDER — CEPHALEXIN 500 MG PO CAPS: ORAL_CAPSULE | ORAL | 0 refills | Status: DC

## 2020-10-07 NOTE — Discharge Instructions (Addendum)
It appears that you have a urinary tract infection. You will need to take an antibiotic for treatment. Please take the entire course of your medication until you have emptied the bottle.  I have also ordered some Pyridium.  This can help to decrease your burning with urination.  It can also make your urine turn bright orange and stain your underwear make sure to wear a panty liner when using this medication. Contact a health care provider if: Your symptoms do not get better after 1-2 days. Your symptoms go away and then return. Get help right away if: You have severe pain in your back or your lower abdomen. You have a fever or chills. You have nausea or vomiting.

## 2020-10-07 NOTE — ED Triage Notes (Signed)
Pt endorses hematuria and bladder pain that began yesterday. Pt has hx of UTIs.

## 2020-10-07 NOTE — ED Provider Notes (Signed)
[facility] DE[facility] Provider Note   CSN: [id number] Arrival date & time: 10/07/20  0907     History Chief Complaint  Patient presents with   Hematuria    Michelle Macdonald is a 85 y.o. female with a past medical history of ovarian cancer, depression, hyperlipidemia, hypertension, schizophrenia who presents emergency department with a chief complaint of hematuria.  Patient states that yesterday she began having hematuria and burning with urination.  She states that yesterday it was not very bad but has worsened.  She is on her way to church this morning but decided to come be evaluated.  She denies flank pain, fevers, chills, nausea, vomiting.  She is never had anything like this before.  She denies any rashes or trauma to the vagina.   Hematuria      Past Medical History:  Diagnosis Date   Arthritis    fingers    Cancer ([place])    ovarian cancer   Depression    Hyperlipidemia    Hypertension     Patient Active Problem List   Diagnosis Date Noted   Malignant neoplasm of corpus uteri, except isthmus ([place]) 07/19/2013   Endometrial cancer ([place]) 06/23/2013   Postmenopausal bleeding 06/09/2013   DIABETES MELLITUS, TYPE II 07/11/2006   HYPERLIPIDEMIA 07/11/2006   SCHIZOPHRENIA NOS, UNSPECIFIED 07/11/2006   HYPERTENSION 07/11/2006   DYSPEPSIA, HX OF 07/11/2006    Past Surgical History:  Procedure Laterality Date   ROBOTIC ASSISTED TOTAL HYSTERECTOMY WITH BILATERAL SALPINGO OOPHERECTOMY Bilateral 07/19/2013   Procedure: ROBOTIC ASSISTED TOTAL HYSTERECTOMY WITH BILATERAL SALPINGO OOPHORECTOMY and lymph node dissecton bilateral;  Surgeon: [name], MD;  Location: [facility];  Service: Gynecology;  Laterality: Bilateral;     OB History     Gravida  3   Para  3   Term  3   Preterm  0   AB  0   Living  1      SAB  0   IAB  0   Ectopic  0   Multiple  0   Live Births              Family History  Problem Relation Age of Onset    Heart disease Mother    Heart disease Sister     Social History   Tobacco Use   Smoking status: Never   Smokeless tobacco: Never  Substance Use Topics   Alcohol use: No   Drug use: No    Home Medications Prior to Admission medications   Medication Sig Start Date End Date Taking? Authorizing Provider  acetaminophen (TYLENOL) 500 MG tablet Take 500 mg by mouth every 6 (six) hours as needed for mild pain.    [provider]  amLODipine (NORVASC) 2.5 MG tablet Take 2.5 mg by mouth daily.     [provider]  amoxicillin-clavulanate (AUGMENTIN) 875-125 MG tablet Take 1 tablet by mouth every 12 (twelve) hours. 06/23/20   Volney American, PA-C  lidocaine (XYLOCAINE) 2 % solution Use as directed 10 mLs in the mouth or throat as needed for mouth pain. 06/23/20   Volney American, PA-C  lurasidone (LATUDA) 20 MG TABS tablet Take 20 mg by mouth daily with breakfast.     [provider]  metFORMIN (GLUCOPHAGE) 500 MG tablet Take 250-500 mg by mouth 2 (two) times daily with a meal. 500 mg in the morning and 250 mg at bedtime    [provider]  [place]  01/19/[birth year]   [provider]    Allergies    Doran Clay hcl]  Review of Systems   Review of Systems  Genitourinary:  Positive for hematuria.  Ten systems reviewed and are negative for acute change, except as noted in the HPI.   Physical Exam Updated Vital Signs BP (!) 157/76 (BP Location: Right Arm)   Pulse 92   Temp 98.2 F (36.8 C) (Oral)   Resp 18   SpO2 97%   Physical Exam Physical Exam  Nursing note and vitals reviewed. Constitutional: She is oriented to person, place, and time. She appears well-developed and well-nourished. No distress.  HENT:  Head: Normocephalic and atraumatic.  Eyes: Conjunctivae normal and EOM are normal. Pupils are equal, round, and reactive to light. No scleral icterus.  Neck: Normal range of motion.  Cardiovascular: Normal  rate, regular rhythm and normal heart sounds.  Exam reveals no gallop and no friction rub.   No murmur heard. Pulmonary/Chest: Effort normal and breath sounds normal. No respiratory distress.  Abdominal: Soft. Bowel sounds are normal. She exhibits no distension and no mass. There is no tenderness. There is no guarding.  No CVA tenderness Neurological: She is alert and oriented to person, place, and time.  Skin: Skin is warm and dry. She is not diaphoretic.   ED Results / Procedures / Treatments   Labs (all labs ordered are listed, but only abnormal results are displayed) Labs Reviewed - No data to display  EKG None  Radiology No results found.  Procedures Procedures   Medications Ordered in ED Medications - No data to display  ED Course  I have reviewed the triage vital signs and the nursing notes.  Pertinent labs & imaging results that were available during my care of the patient were reviewed by me and considered in my medical decision making (see chart for details).    MDM Rules/Calculators/A&P                         85 year old female here with hematuria and dysuria.  She has no complaint of flank pain suggestive of kidney stone or ureteral colic. I ordered and reviewed labs that include CBC which shows no elevated white blood Cell count or other significant abnormality, BMP with mildly elevated glucose and creatinine of also of insignificant value.  Patient's urine appears infected with bacteria white blood cells and hematuria.  Patient will be treated for urinary tract infection.  Urine sent for culture.  She will follow-up with her primary care physician.  Discharged with Keflex and Pyridium.  Discussed return precautions.  She appears otherwise appropriate for discharge at this time. Final Clinical Impression(s) / ED Diagnoses Final diagnoses:  None    Rx / DC Orders ED Discharge Orders     None        Margarita Mail, PA-C 10/07/20 [place],  [place], DO 10/07/20 2305

## 2021-02-20 ENCOUNTER — Emergency Department (HOSPITAL_COMMUNITY): Payer: Medicare Other

## 2021-02-20 ENCOUNTER — Emergency Department (HOSPITAL_COMMUNITY)
Admission: EM | Admit: 2021-02-20 | Discharge: 2021-02-20 | Disposition: A | Discharge status: Home / Self Care | Payer: Medicare Other | Attending: Emergency Medicine | Admitting: Emergency Medicine

## 2021-02-20 ENCOUNTER — Other Ambulatory Visit: Payer: Self-pay

## 2021-02-20 DIAGNOSIS — Z79899 Other long term (current) drug therapy: Secondary | ICD-10-CM | POA: Diagnosis not present

## 2021-02-20 DIAGNOSIS — Z7984 Long term (current) use of oral hypoglycemic drugs: Secondary | ICD-10-CM | POA: Diagnosis not present

## 2021-02-20 DIAGNOSIS — Z20822 Contact with and (suspected) exposure to covid-19: Secondary | ICD-10-CM | POA: Diagnosis not present

## 2021-02-20 DIAGNOSIS — R42 Dizziness and giddiness: Secondary | ICD-10-CM | POA: Insufficient documentation

## 2021-02-20 LAB — URINALYSIS, ROUTINE W REFLEX MICROSCOPIC
Bilirubin Urine: NEGATIVE
Glucose, UA: NEGATIVE mg/dL
Hgb urine dipstick: NEGATIVE
Ketones, ur: NEGATIVE mg/dL
Nitrite: NEGATIVE
Protein, ur: NEGATIVE mg/dL
Specific Gravity, Urine: 1.004 — ABNORMAL LOW (ref 1.005–1.030)
pH: 5 (ref 5.0–8.0)

## 2021-02-20 LAB — COMPREHENSIVE METABOLIC PANEL
ALT: 8 U/L (ref 0–44)
AST: 28 U/L (ref 15–41)
Albumin: 4.1 g/dL (ref 3.5–5.0)
Alkaline Phosphatase: 79 U/L (ref 38–126)
Anion gap: 7 (ref 5–15)
BUN: 14 mg/dL (ref 8–23)
CO2: 27 mmol/L (ref 22–32)
Calcium: 9.4 mg/dL (ref 8.9–10.3)
Chloride: 106 mmol/L (ref 98–111)
Creatinine, Ser: 0.94 mg/dL (ref 0.44–1.00)
GFR, Estimated: >60 mL/min (ref >60)
Glucose, Bld: 169 mg/dL — ABNORMAL HIGH (ref 70–99)
Potassium: 4.1 mmol/L (ref 3.5–5.1)
Sodium: 140 mmol/L (ref 135–145)
Total Bilirubin: 0.7 mg/dL (ref 0.3–1.2)
Total Protein: 7.6 g/dL (ref 6.5–8.1)

## 2021-02-20 LAB — RESP PANEL BY RT-PCR (FLU A&B, COVID) ARPGX2
Influenza A by PCR: NEGATIVE
Influenza B by PCR: NEGATIVE
SARS Coronavirus 2 by RT PCR: NEGATIVE

## 2021-02-20 LAB — CBC WITH DIFFERENTIAL/PLATELET
Abs Immature Granulocytes: 0.01 10*3/uL (ref 0.00–0.07)
Basophils Absolute: 0 10*3/uL (ref 0.0–0.1)
Basophils Relative: 1 %
Eosinophils Absolute: 0 10*3/uL (ref 0.0–0.5)
Eosinophils Relative: 1 %
HCT: 43.9 % (ref 36.0–46.0)
Hemoglobin: 14.1 g/dL (ref 12.0–15.0)
Immature Granulocytes: 0 %
Lymphocytes Relative: 25 %
Lymphs Abs: 1.1 10*3/uL (ref 0.7–4.0)
MCH: 30.5 pg (ref 26.0–34.0)
MCHC: 32.1 g/dL (ref 30.0–36.0)
MCV: 94.8 fL (ref 80.0–100.0)
Monocytes Absolute: 0.4 10*3/uL (ref 0.1–1.0)
Monocytes Relative: 8 %
Neutro Abs: 2.9 10*3/uL (ref 1.7–7.7)
Neutrophils Relative %: 65 %
Platelets: 300 10*3/uL (ref 150–400)
RBC: 4.63 MIL/uL (ref 3.87–5.11)
RDW: 13.2 % (ref 11.5–15.5)
WBC: 4.4 10*3/uL (ref 4.0–10.5)
nRBC: 0 % (ref 0.0–0.2)

## 2021-02-20 LAB — CBG MONITORING, ED: Glucose-Capillary: 158 mg/dL — ABNORMAL HIGH (ref 70–99)

## 2021-02-20 MED ORDER — MECLIZINE HCL 25 MG PO TABS: 25.0000 mg | ORAL_TABLET | Freq: Three times a day (TID) | ORAL | 0 refills | Status: DC | PRN

## 2021-02-20 MED ORDER — MECLIZINE HCL 25 MG PO TABS
25.0000 mg | ORAL_TABLET | Freq: Once | ORAL | Status: AC
  Administered 2021-02-20: 25 mg via ORAL
  Filled 2021-02-20: qty 1

## 2021-02-20 MED ORDER — ONDANSETRON HCL 4 MG PO TABS: 4.0000 mg | ORAL_TABLET | Freq: Four times a day (QID) | ORAL | 0 refills | Status: DC

## 2021-02-20 NOTE — ED Provider Notes (Signed)
[facility] DEPT Provider Note   CSN: [id number] Arrival date & time: 02/20/21  0944     History  Chief Complaint  Patient presents with   Dizziness    Michelle Macdonald is a 86 y.o. female.  86 year old female with prior medical history as detailed below presents for evaluation.  Patient reports significant dizziness.  She reports onset of symptoms Monday morning when she awoke.  She reports that she felt fine on Sunday.  She complains of feeling like the room is spinning when she tries to stand or ambulate.  She has to hold onto objects in order to maintain her balance while attempting to ambulate.  Prior to onset of symptoms she denies difficulty ambulating.  She did not require use of cane or walker.  She denies headache.  She denies visual change.  She denies speech change.  She denies focal weakness.  She has not taken anything for her symptoms.  She denies prior history of vertigo.  The history is provided by the patient.  Dizziness Quality:  Room spinning Severity:  Moderate Onset quality:  Sudden Duration:  3 days Timing:  Constant Progression:  Unchanged Chronicity:  New     Home Medications Prior to Admission medications   Medication Sig Start Date End Date Taking? Authorizing Provider  acetaminophen (TYLENOL) 500 MG tablet Take 500 mg by mouth every 6 (six) hours as needed for mild pain.    [provider]  amLODipine (NORVASC) 2.5 MG tablet Take 2.5 mg by mouth daily.     [provider]  amoxicillin-clavulanate (AUGMENTIN) 875-125 MG tablet Take 1 tablet by mouth every 12 (twelve) hours. 06/23/20   Michelle American, PA-C  cephALEXin (KEFLEX) 500 MG capsule 2 caps po bid x 7 days 10/07/20   Michelle Mail, PA-C  lidocaine (XYLOCAINE) 2 % solution Use as directed 10 mLs in the mouth or throat as needed for mouth pain. 06/23/20   Michelle American, PA-C  lurasidone (LATUDA) 20 MG TABS tablet Take 20 mg  by mouth daily with breakfast.     [provider]  metFORMIN (GLUCOPHAGE) 500 MG tablet Take 250-500 mg by mouth 2 (two) times daily with a meal. 500 mg in the morning and 250 mg at bedtime    [provider]  Michelle Macdonald  [birth year]   [provider]  phenazopyridine (PYRIDIUM) 200 MG tablet Take 1 tablet (200 mg total) by mouth 3 (three) times daily as needed for pain (Burning with urination). 10/07/20   Michelle Mail, PA-C      Allergies    Michelle Macdonald [lurasidone hcl]    Review of Systems   Review of Systems  Neurological:  Positive for dizziness.  All other systems reviewed and are negative.  Physical Exam Updated Vital Signs BP (!) 181/84 (BP Location: Right Arm)    Pulse 74    Temp 98.6 F (37 C) (Oral)    Resp 16    SpO2 98%  Physical Exam Vitals and nursing note reviewed.  Constitutional:      General: She is not in acute distress.    Appearance: Normal appearance. She is well-developed.  HENT:     Head: Normocephalic and atraumatic.  Eyes:     Conjunctiva/sclera: Conjunctivae normal.     Pupils: Pupils are equal, round, and reactive to light.  Cardiovascular:     Rate and Rhythm: Normal rate and regular rhythm.     Heart sounds: Normal heart sounds.  Pulmonary:     Effort: Pulmonary effort is normal. No respiratory distress.     Breath sounds: Normal breath sounds.  Abdominal:     General: There is no distension.     Palpations: Abdomen is soft.     Tenderness: There is no abdominal tenderness.  Musculoskeletal:        General: No deformity. Normal range of motion.     Cervical back: Normal range of motion and neck supple.  Skin:    General: Skin is warm and dry.  Neurological:     General: No focal deficit present.     Mental Status: She is alert and oriented to person, place, and time. Mental status is at baseline.     Cranial Nerves: No cranial nerve deficit.     Sensory: No sensory deficit.     Motor: No weakness.      Coordination: Coordination normal.    ED Results / Procedures / Treatments   Labs (all labs ordered are listed, but only abnormal results are displayed) Labs Reviewed  CBG MONITORING, ED - Abnormal; Notable for the following components:      Result Value   Glucose-Capillary 158 (*)    All other components within normal limits  RESP PANEL BY RT-PCR (FLU A&B, COVID) ARPGX2  URINALYSIS, ROUTINE W REFLEX MICROSCOPIC  COMPREHENSIVE METABOLIC PANEL  CBC WITH DIFFERENTIAL/PLATELET    EKG EKG Interpretation  Date/Time:  Wednesday February 20 2021 10:17:12 EST Ventricular Rate:  70 PR Interval:  144 QRS Duration: 71 QT Interval:  386 QTC Calculation: 417 R Axis:   42 Text Interpretation: Sinus rhythm Abnormal R-wave progression, early transition Confirmed by Dene Gentry [id number]) on 02/20/2021 10:23:09 AM  Radiology No results found.  Procedures Procedures    Medications Ordered in ED Medications  meclizine (ANTIVERT) tablet 25 mg (25 mg Oral Given 02/20/21 1018)    ED Course/ Medical Decision Making/ A&P                           Medical Decision Making   Medical Screen Complete  This patient presented to the ED with complaint of vertigo.  This complaint involves an extensive number of treatment options. The initial differential diagnosis includes, but is not limited to, benign positional vertigo, central vertigo, CVA, metabolic abnormality, etc  This presentation is: Acute, Previously Undiagnosed, Uncertain Prognosis, Complicated, Systemic Symptoms, and Threat to Life/Bodily Function  Patient presented with complaint of dizziness.  Patient's describe symptoms are consistent with likely vertigo.  Exam is most suggestive of likely peripheral vertigo.  However, given patient's age and comorbidities we will pain imaging to rule out CVA.  MRI is without evidence of acute pathology.  Patient is significantly improved after administration of meclizine.  She is able to  ambulate without difficulty and with minimal symptoms.  She desires discharge home.  Importance of close follow-up is stressed.  Strict return precautions given and understood.   Co morbidities that complicated the patient's evaluation  Advanced age, hypertension, schizophrenia   Additional history obtained:  Additional history obtained from family External records from outside sources obtained and reviewed including prior ED visits and prior Inpatient records.    Lab Tests:  I ordered and personally interpreted labs.  The pertinent results include: Obtained labs are without significant acute abnormality.   Imaging Studies ordered:  I ordered imaging studies including MRI brain I independently visualized and interpreted obtained imaging which showed no acute pathology I  agree with the radiologist interpretation.   Cardiac Monitoring:  The patient was maintained on a cardiac monitor.  I personally viewed and interpreted the cardiac monitor which showed an underlying rhythm of: Sinus rhythm   Medicines ordered:  I ordered medication including meclizine for vertigo Reevaluation of the patient after these medicines showed that the patient: improved     Problem List / ED Course:  Vertigo   Reevaluation:  After the interventions noted above, I reevaluated the patient and found that they have: improved     Dispostion:  After consideration of the diagnostic results and the patients response to treatment, I feel that the patent would benefit from close outpatient follow-up.          Final Clinical Impression(s) / ED Diagnoses Final diagnoses:  Vertigo    Rx / DC Orders ED Discharge Orders          Ordered    meclizine (ANTIVERT) 25 MG tablet  3 times daily PRN,   Status:  Discontinued        02/20/21 1205    ondansetron (ZOFRAN) 4 MG tablet  Every 6 hours,   Status:  Discontinued        02/20/21 1205    ondansetron (ZOFRAN) 4 MG tablet  Every 6  hours        02/20/21 1238    meclizine (ANTIVERT) 25 MG tablet  3 times daily PRN        02/20/21 1238              Valarie Merino, MD 02/20/21 1251

## 2021-02-20 NOTE — Discharge Instructions (Addendum)
Return for any problem.   Take Meclizine as needed for dizziness.

## 2021-02-20 NOTE — ED Triage Notes (Signed)
Patient c/o dizziness for 3 days. Describes it as a spinning sensation. No weakness.

## 2021-12-16 ENCOUNTER — Ambulatory Visit (HOSPITAL_COMMUNITY)
Admission: EM | Admit: 2021-12-16 | Discharge: 2021-12-16 | Disposition: A | Discharge status: Home / Self Care | Payer: Medicare Other

## 2021-12-16 ENCOUNTER — Other Ambulatory Visit: Payer: Self-pay

## 2021-12-16 ENCOUNTER — Encounter (HOSPITAL_COMMUNITY): Payer: Self-pay | Admitting: Emergency Medicine

## 2021-12-16 DIAGNOSIS — R110 Nausea: Secondary | ICD-10-CM | POA: Diagnosis not present

## 2021-12-16 DIAGNOSIS — R42 Dizziness and giddiness: Secondary | ICD-10-CM | POA: Diagnosis not present

## 2021-12-16 MED ORDER — MECLIZINE HCL 25 MG PO TABS: 25.0000 mg | ORAL_TABLET | Freq: Three times a day (TID) | ORAL | 0 refills | Status: AC | PRN

## 2021-12-16 MED ORDER — ONDANSETRON 4 MG PO TBDP
ORAL_TABLET | ORAL | Status: AC
  Filled 2021-12-16: qty 1

## 2021-12-16 MED ORDER — ONDANSETRON HCL 4 MG PO TABS: 4.0000 mg | ORAL_TABLET | Freq: Four times a day (QID) | ORAL | 0 refills | Status: AC

## 2021-12-16 MED ORDER — ONDANSETRON 4 MG PO TBDP
4.0000 mg | ORAL_TABLET | Freq: Once | ORAL | Status: AC
  Administered 2021-12-16: 4 mg via ORAL

## 2021-12-16 NOTE — Discharge Instructions (Addendum)
Advised to take the Antivert 12.5 mg, 1 tablet every 6 hours as needed for dizziness. Advised to take the Zofran 4 mg, 1 tablet every 6-8 hours as needed for nausea. Be careful with positional changes such as getting up from sitting, turning, as positional changes will aggravate the dizziness. Advised to follow-up PCP or return to urgent care if symptoms fail to improve.

## 2021-12-16 NOTE — ED Provider Notes (Signed)
[place]    CSN: [id number] Arrival date & time: 12/16/21  8891      History   Chief Complaint Chief Complaint  Patient presents with   Dizziness    HPI Michelle Macdonald is a 86 y.o. female.   86 year old female presents with dizziness.  Patient indicates for the past 2 days she has been having dizziness, off-balance sensation with nausea.  Patient indicates that the dizziness and nausea is worse when she bends over, standing and turning increases the dizziness and off-balance sensation.  Patient indicates she has better when she is just sitting still looking straight ahead.  Patient indicates that she has had vertigo in the past and this is the same feeling she got when she had her first case of vertigo.  Patient denies fever, chills, headache, vision changes, numbness or weakness of the upper or lower extremities.  Patient indicates that she is not having any shortness of breath or chest pain.   Dizziness Associated symptoms: nausea     Past Medical History:  Diagnosis Date   Arthritis    fingers    Cancer ([place])    ovarian cancer   Depression    Hyperlipidemia    Hypertension     Patient Active Problem List   Diagnosis Date Noted   Malignant neoplasm of corpus uteri, except isthmus ([place]) 07/19/2013   Endometrial cancer ([place]) 06/23/2013   Postmenopausal bleeding 06/09/2013   DIABETES MELLITUS, TYPE II 07/11/2006   HYPERLIPIDEMIA 07/11/2006   SCHIZOPHRENIA NOS, UNSPECIFIED 07/11/2006   HYPERTENSION 07/11/2006   DYSPEPSIA, HX OF 07/11/2006    Past Surgical History:  Procedure Laterality Date   ROBOTIC ASSISTED TOTAL HYSTERECTOMY WITH BILATERAL SALPINGO OOPHERECTOMY Bilateral 07/19/2013   Procedure: ROBOTIC ASSISTED TOTAL HYSTERECTOMY WITH BILATERAL SALPINGO OOPHORECTOMY and lymph node dissecton bilateral;  Surgeon: [name], MD;  Location: [facility];  Service: Gynecology;  Laterality: Bilateral;    OB History     Gravida  3   Para  3   Term   3   Preterm  0   AB  0   Living  1      SAB  0   IAB  0   Ectopic  0   Multiple  0   Live Births               Home Medications    Prior to Admission medications   Medication Sig Start Date End Date Taking? Authorizing Provider  INVEGA TRINZA 410 MG/1.32ML injection Inject into the muscle. 12/12/21  Yes [provider]  acetaminophen (TYLENOL) 500 MG tablet Take 500 mg by mouth every 6 (six) hours as needed for mild pain.    [provider]  amLODipine (NORVASC) 2.5 MG tablet Take 2.5 mg by mouth daily.     [provider]  amoxicillin-clavulanate (AUGMENTIN) 875-125 MG tablet Take 1 tablet by mouth every 12 (twelve) hours. Patient not taking: Reported on 12/16/2021 06/23/20   Volney American, PA-C  cephALEXin [facility]) 500 MG capsule 2 caps po bid x 7 days Patient not taking: Reported on 12/16/2021 10/07/20   Margarita Mail, PA-C  lidocaine (XYLOCAINE) 2 % solution Use as directed 10 mLs in the mouth or throat as needed for mouth pain. 06/23/20   Volney American, PA-C  lurasidone (LATUDA) 20 MG TABS tablet Take 20 mg by mouth daily with breakfast.     [provider]  meclizine (ANTIVERT) 25 MG tablet Take 1 tablet (25 mg  total) by mouth 3 (three) times daily as needed for dizziness. 12/16/21   Nyoka Lint, PA-C  metFORMIN (GLUCOPHAGE) 500 MG tablet Take 250-500 mg by mouth 2 (two) times daily with a meal. 500 mg in the morning and 250 mg at bedtime Patient not taking: Reported on 12/16/2021    [provider]  Sheffield Chunky  01/19/15   [provider]  ondansetron (ZOFRAN) 4 MG tablet Take 1 tablet (4 mg total) by mouth every 6 (six) hours. 12/16/21   Nyoka Lint, PA-C  phenazopyridine (PYRIDIUM) 200 MG tablet Take 1 tablet (200 mg total) by mouth 3 (three) times daily as needed for pain (Burning with urination). Patient not taking: Reported on 12/16/2021 10/07/20   Margarita Mail, PA-C    Family  History Family History  Problem Relation Age of Onset   Heart disease Mother    Heart disease Sister     Social History Social History   Tobacco Use   Smoking status: Never   Smokeless tobacco: Never  Vaping Use   Vaping Use: Never used  Substance Use Topics   Alcohol use: No   Drug use: No     Allergies   Latuda [lurasidone hcl]   Review of Systems Review of Systems  Gastrointestinal:  Positive for nausea.  Neurological:  Positive for dizziness.     Physical Exam Triage Vital Signs ED Triage Vitals  Enc Vitals Group     BP 12/16/21 1045 (!) 213/78     Pulse Rate 12/16/21 1045 71     Resp 12/16/21 1045 20     Temp 12/16/21 1045 98.1 F (36.7 C)     Temp Source 12/16/21 1045 Oral     SpO2 12/16/21 1045 95 %     Weight --      Height --      Head Circumference --      Peak Flow --      Pain Score 12/16/21 1042 0     Pain Loc --      Pain Edu? --      Excl. in [place]? --    No data found.  Updated Vital Signs BP (!) 191/91 (BP Location: Left Arm)   Pulse 71   Temp 98.1 F (36.7 C) (Oral)   Resp 20   SpO2 95%   Visual Acuity Right Eye Distance:   Left Eye Distance:   Bilateral Distance:    Right Eye Near:   Left Eye Near:    Bilateral Near:     Physical Exam Constitutional:      Appearance: Normal appearance.  HENT:     Right Ear: Tympanic membrane and ear canal normal.     Left Ear: Tympanic membrane and ear canal normal.     Mouth/Throat:     Mouth: Mucous membranes are moist.     Pharynx: Oropharynx is clear. Uvula midline.  Cardiovascular:     Rate and Rhythm: Normal rate and regular rhythm.     Heart sounds: Normal heart sounds.  Pulmonary:     Effort: Pulmonary effort is normal.     Breath sounds: Normal breath sounds and air entry. No wheezing, rhonchi or rales.  Lymphadenopathy:     Cervical: No cervical adenopathy.  Neurological:     Mental Status: She is alert and oriented to person, place, and time.     Motor: Motor function  is intact.     Coordination: Coordination is intact.      UC  Treatments / Results  Labs (all labs ordered are listed, but only abnormal results are displayed) Labs Reviewed - No data to display  EKG   Radiology No results found.  Procedures Procedures (including critical care time)  Medications Ordered in UC Medications  ondansetron (ZOFRAN-ODT) disintegrating tablet 4 mg (has no administration in time range)    Initial Impression / Assessment and Plan / UC Course  I have reviewed the triage vital signs and the nursing notes.  Pertinent labs & imaging results that were available during my care of the patient were reviewed by me and considered in my medical decision making (see chart for details).    Plan: 1.  The vertigo will be treated with the following: A.  Antivert 12.5 mg, 1 every 6 hours to help control the dizziness. 2.  The nausea will be controlled with the following A.  Zofran 4 mg, 1 every 6 hours to control the nausea. 3.  Patient advised to follow-up with PCP or return to urgent care if symptoms fail to improve Final Clinical Impressions(s) / UC Diagnoses   Final diagnoses:  Vertigo  Nausea without vomiting     Discharge Instructions      Advised to take the Antivert 12.5 mg, 1 tablet every 6 hours as needed for dizziness. Advised to take the Zofran 4 mg, 1 tablet every 6-8 hours as needed for nausea. Be careful with positional changes such as getting up from sitting, turning, as positional changes will aggravate the dizziness. Advised to follow-up PCP or return to urgent care if symptoms fail to improve.    ED Prescriptions     Medication Sig Dispense Auth. Provider   meclizine (ANTIVERT) 25 MG tablet Take 1 tablet (25 mg total) by mouth 3 (three) times daily as needed for dizziness. 30 tablet Nyoka Lint, PA-C   ondansetron (ZOFRAN) 4 MG tablet Take 1 tablet (4 mg total) by mouth every 6 (six) hours. 12 tablet Nyoka Lint, PA-C      PDMP  not reviewed this encounter.   Nyoka Lint, PA-C 12/16/21 1105

## 2021-12-16 NOTE — ED Triage Notes (Signed)
Patient reports if she holds her hed down, she gets dizzy and nauseated.  Symptoms started over the weekend.  Reports her doctor is out of office until Tuesday.    Denies pain, patient has a history of the same.  Patient has had vertigo and thinks this is what is going on now

## 2023-02-11 ENCOUNTER — Other Ambulatory Visit: Payer: Self-pay

## 2023-02-11 ENCOUNTER — Emergency Department (HOSPITAL_COMMUNITY)
Admission: EM | Admit: 2023-02-11 | Discharge: 2023-02-11 | Disposition: A | Discharge status: Home / Self Care | Payer: Medicare Other | Attending: Emergency Medicine | Admitting: Emergency Medicine

## 2023-02-11 ENCOUNTER — Encounter (HOSPITAL_COMMUNITY): Payer: Self-pay

## 2023-02-11 DIAGNOSIS — N3000 Acute cystitis without hematuria: Secondary | ICD-10-CM | POA: Diagnosis not present

## 2023-02-11 DIAGNOSIS — E119 Type 2 diabetes mellitus without complications: Secondary | ICD-10-CM | POA: Diagnosis not present

## 2023-02-11 DIAGNOSIS — Z7984 Long term (current) use of oral hypoglycemic drugs: Secondary | ICD-10-CM | POA: Diagnosis not present

## 2023-02-11 DIAGNOSIS — R1030 Lower abdominal pain, unspecified: Secondary | ICD-10-CM | POA: Diagnosis present

## 2023-02-11 DIAGNOSIS — I10 Essential (primary) hypertension: Secondary | ICD-10-CM | POA: Diagnosis not present

## 2023-02-11 DIAGNOSIS — Z79899 Other long term (current) drug therapy: Secondary | ICD-10-CM | POA: Diagnosis not present

## 2023-02-11 LAB — URINALYSIS, ROUTINE W REFLEX MICROSCOPIC
Bilirubin Urine: NEGATIVE
Glucose, UA: 50 mg/dL — AB
Ketones, ur: NEGATIVE mg/dL
Nitrite: NEGATIVE
Protein, ur: 100 mg/dL — AB
Specific Gravity, Urine: 1.005 (ref 1.005–1.030)
WBC, UA: >50 WBC/hpf (ref 0–5)
pH: 5 (ref 5.0–8.0)

## 2023-02-11 LAB — CBG MONITORING, ED: Glucose-Capillary: 214 mg/dL — ABNORMAL HIGH (ref 70–99)

## 2023-02-11 MED ORDER — CEPHALEXIN 500 MG PO CAPS: 500.0000 mg | ORAL_CAPSULE | Freq: Two times a day (BID) | ORAL | 0 refills | Status: DC

## 2023-02-11 MED ORDER — CEPHALEXIN 500 MG PO CAPS
500.0000 mg | ORAL_CAPSULE | Freq: Once | ORAL | Status: AC
  Administered 2023-02-11: 500 mg via ORAL
  Filled 2023-02-11: qty 1

## 2023-02-11 MED ORDER — CEPHALEXIN 500 MG PO CAPS: 500.0000 mg | ORAL_CAPSULE | Freq: Two times a day (BID) | ORAL | 0 refills | Status: AC

## 2023-02-11 NOTE — ED Provider Notes (Signed)
 [place] EMERGENCY DEPARTMENT AT [facility] Provider Note   CSN: [id number] Arrival date & time: 02/11/23  9150     History  Chief Complaint  Patient presents with   uti    Michelle Macdonald is a 88 y.o. female.  Patient with history of hypertension, diabetes, previous UTI --presents to the emergency department for irritative UTI symptoms.  She reports burning with urination and mild suprapubic pain.  She denies fever, flank pain or vomiting.  She states that symptoms feel like similar UTI symptoms.  No treatments prior to arrival.  No history of diverticulitis.       Home Medications Prior to Admission medications   Medication Sig Start Date End Date Taking? Authorizing Provider  cephALEXin  (KEFLEX ) 500 MG capsule Take 1 capsule (500 mg total) by mouth 2 (two) times daily for 7 days. 02/11/23 02/18/23 Yes Evadene Wardrip, PA-C  acetaminophen  (TYLENOL ) 500 MG tablet Take 500 mg by mouth every 6 (six) hours as needed for mild pain.    [provider]  amLODipine  (NORVASC ) 2.5 MG tablet Take 2.5 mg by mouth daily.     [provider]  INVEGA TRINZA 410 MG/1.32ML injection Inject into the muscle. 12/12/21   [provider]  lidocaine  (XYLOCAINE ) 2 % solution Use as directed 10 mLs in the mouth or throat as needed for mouth pain. 06/23/20   Stuart Vernell Norris, PA-C  lurasidone (LATUDA) 20 MG TABS tablet Take 20 mg by mouth daily with breakfast.     [provider]  meclizine  (ANTIVERT ) 25 MG tablet Take 1 tablet (25 mg total) by mouth 3 (three) times daily as needed for dizziness. 12/16/21   Lynwood Lenis, PA-C  metFORMIN  (GLUCOPHAGE ) 500 MG tablet Take 250-500 mg by mouth 2 (two) times daily with a meal. 500 mg in the morning and 250 mg at bedtime Patient not taking: Reported on 12/16/2021    [provider]  MICROLET LANCETS MISC  01/19/15   [provider]  ondansetron  (ZOFRAN ) 4 MG tablet Take 1 tablet (4 mg total) by  mouth every 6 (six) hours. 12/16/21   Lynwood Lenis, PA-C      Allergies    Oleta gasser hcl]    Review of Systems   Review of Systems  Physical Exam Updated Vital Signs BP (!) 169/68 (BP Location: Right Arm)   Pulse 88   Temp 97.6 F (36.4 C) (Oral)   Resp 18   Ht 5' 1 (1.549 m)   Wt 70.3 kg   SpO2 100%   BMI 29.28 kg/m  Physical Exam Vitals and nursing note reviewed.  Constitutional:      Appearance: She is well-developed.  HENT:     Head: Normocephalic and atraumatic.  Eyes:     Conjunctiva/sclera: Conjunctivae normal.  Pulmonary:     Effort: No respiratory distress.  Musculoskeletal:     Cervical back: Normal range of motion and neck supple.  Skin:    General: Skin is warm and dry.  Neurological:     Mental Status: She is alert.     ED Results / Procedures / Treatments   Labs (all labs ordered are listed, but only abnormal results are displayed) Labs Reviewed  URINALYSIS, ROUTINE W REFLEX MICROSCOPIC - Abnormal; Notable for the following components:      Result Value   APPearance CLOUDY (*)    Glucose, UA 50 (*)    Hgb urine dipstick LARGE (*)    Protein, ur 100 (*)  Leukocytes,Ua LARGE (*)    Bacteria, UA MANY (*)    Non Squamous Epithelial 0-5 (*)    All other components within normal limits  CBG MONITORING, ED - Abnormal; Notable for the following components:   Glucose-Capillary 214 (*)    All other components within normal limits    EKG None  Radiology No results found.  Procedures Procedures    Medications Ordered in ED Medications  cephALEXin  (KEFLEX ) capsule 500 mg (500 mg Oral Given 02/11/23 1029)    ED Course/ Medical Decision Making/ A&P    Patient seen and examined. History obtained directly from patient. Work-up including labs, imaging, EKG ordered in triage, if performed, were reviewed.    Labs/EKG: Independently reviewed and interpreted.  This included: UA with greater than 50 white blood cells, many bacteria,  clean-catch, suggestive of UTI; CBG 214.  Imaging: None ordered  Medications/Fluids: Ordered: P.o. Keflex   Most recent vital signs reviewed and are as follows: BP (!) 169/68 (BP Location: Right Arm)   Pulse 88   Temp 97.6 F (36.4 C) (Oral)   Resp 18   Ht 5' 1 (1.549 m)   Wt 70.3 kg   SpO2 100%   BMI 29.28 kg/m   Initial impression: Uncomplicated cystitis and patient with history of same, she has been treated in the past with Keflex   Home treatment plan: Hydration  Return instructions discussed with patient: Flank pain, vomiting, fever  Follow-up instructions discussed with patient: PCPin 72 hours for recheck if not improving                                Medical Decision Making Amount and/or Complexity of Data Reviewed Labs: ordered.  Risk Prescription drug management.   Well-appearing patient with irritative UTI symptoms.  No significant abdominal pain.  UA consistent with UTI.  No signs of pyelonephritis.  Low concern for other intra-abdominal etiology at this time.         Final Clinical Impression(s) / ED Diagnoses Final diagnoses:  Acute cystitis without hematuria    Rx / DC Orders ED Discharge Orders          Ordered    cephALEXin  (KEFLEX ) 500 MG capsule  2 times daily        02/11/23 1027              Desiderio Chew, PA-C 02/11/23 1039    Levander Houston, MD 02/16/23 1733

## 2023-02-11 NOTE — ED Triage Notes (Signed)
 Pt states that she has had some burning with urination since yesterday.

## 2023-02-11 NOTE — Discharge Instructions (Signed)
 Please read and follow all provided instructions.  Your diagnoses today include:  1. Acute cystitis without hematuria     Tests performed today include: Urine test - suggests that you have an infection in your bladder Vital signs. See below for your results today.   Medications prescribed:  Keflex  (cephalexin ) - antibiotic  You have been prescribed an antibiotic medicine: take the entire course of medicine even if you are feeling better. Stopping early can cause the antibiotic not to work.  Home care instructions:  Follow any educational materials contained in this packet.  Follow-up instructions: Please follow-up with your primary care provider in 3 days if symptoms are not resolved for further evaluation of your symptoms.  Return instructions:  Please return to the Emergency Department if you experience worsening symptoms.  Return with fever, worsening pain, persistent vomiting, worsening pain in your back.  Please return if you have any other emergent concerns.  Additional Information:  Your vital signs today were: BP (!) 169/68 (BP Location: Right Arm)   Pulse 88   Temp 97.6 F (36.4 C) (Oral)   Resp 18   Ht 5' 1 (1.549 m)   Wt 70.3 kg   SpO2 100%   BMI 29.28 kg/m  If your blood pressure (BP) was elevated above 135/85 this visit, please have this repeated by your doctor within one month. --------------

## 2023-04-23 ENCOUNTER — Emergency Department (HOSPITAL_COMMUNITY)
Admission: EM | Admit: 2023-04-23 | Discharge: 2023-04-23 | Disposition: A | Discharge status: Home / Self Care | Attending: Emergency Medicine | Admitting: Emergency Medicine

## 2023-04-23 ENCOUNTER — Emergency Department (HOSPITAL_COMMUNITY)

## 2023-04-23 DIAGNOSIS — B9789 Other viral agents as the cause of diseases classified elsewhere: Secondary | ICD-10-CM

## 2023-04-23 DIAGNOSIS — S0990XA Unspecified injury of head, initial encounter: Secondary | ICD-10-CM | POA: Diagnosis not present

## 2023-04-23 DIAGNOSIS — R55 Syncope and collapse: Secondary | ICD-10-CM | POA: Diagnosis present

## 2023-04-23 DIAGNOSIS — Z79899 Other long term (current) drug therapy: Secondary | ICD-10-CM | POA: Insufficient documentation

## 2023-04-23 DIAGNOSIS — I10 Essential (primary) hypertension: Secondary | ICD-10-CM | POA: Insufficient documentation

## 2023-04-23 DIAGNOSIS — Z8543 Personal history of malignant neoplasm of ovary: Secondary | ICD-10-CM | POA: Insufficient documentation

## 2023-04-23 DIAGNOSIS — W01198A Fall on same level from slipping, tripping and stumbling with subsequent striking against other object, initial encounter: Secondary | ICD-10-CM | POA: Diagnosis not present

## 2023-04-23 DIAGNOSIS — J069 Acute upper respiratory infection, unspecified: Secondary | ICD-10-CM | POA: Insufficient documentation

## 2023-04-23 LAB — BASIC METABOLIC PANEL
Anion gap: 3 — ABNORMAL LOW (ref 5–15)
BUN: 11 mg/dL (ref 8–23)
CO2: 32 mmol/L (ref 22–32)
Calcium: 9 mg/dL (ref 8.9–10.3)
Chloride: 105 mmol/L (ref 98–111)
Creatinine, Ser: 1.13 mg/dL — ABNORMAL HIGH (ref 0.44–1.00)
GFR, Estimated: 48 mL/min — ABNORMAL LOW (ref >60)
Glucose, Bld: 197 mg/dL — ABNORMAL HIGH (ref 70–99)
Potassium: 3.6 mmol/L (ref 3.5–5.1)
Sodium: 140 mmol/L (ref 135–145)

## 2023-04-23 LAB — CBC
HCT: 42 % (ref 36.0–46.0)
Hemoglobin: 13.7 g/dL (ref 12.0–15.0)
MCH: 30.9 pg (ref 26.0–34.0)
MCHC: 32.6 g/dL (ref 30.0–36.0)
MCV: 94.8 fL (ref 80.0–100.0)
Platelets: 221 10*3/uL (ref 150–400)
RBC: 4.43 MIL/uL (ref 3.87–5.11)
RDW: 12.8 % (ref 11.5–15.5)
WBC: 7.9 10*3/uL (ref 4.0–10.5)
nRBC: 0 % (ref 0.0–0.2)

## 2023-04-23 LAB — URINALYSIS, ROUTINE W REFLEX MICROSCOPIC
Bilirubin Urine: NEGATIVE
Glucose, UA: NEGATIVE mg/dL
Hgb urine dipstick: NEGATIVE
Ketones, ur: NEGATIVE mg/dL
Nitrite: NEGATIVE
Protein, ur: NEGATIVE mg/dL
Specific Gravity, Urine: 1.005 (ref 1.005–1.030)
pH: 6 (ref 5.0–8.0)

## 2023-04-23 LAB — RESP PANEL BY RT-PCR (RSV, FLU A&B, COVID)  RVPGX2
Influenza A by PCR: NEGATIVE
Influenza B by PCR: NEGATIVE
Resp Syncytial Virus by PCR: NEGATIVE
SARS Coronavirus 2 by RT PCR: NEGATIVE

## 2023-04-23 LAB — CBG MONITORING, ED: Glucose-Capillary: 178 mg/dL — ABNORMAL HIGH (ref 70–99)

## 2023-04-23 LAB — GROUP A STREP BY PCR: Group A Strep by PCR: NOT DETECTED

## 2023-04-23 LAB — TROPONIN I (HIGH SENSITIVITY)
Troponin I (High Sensitivity): 5 ng/L (ref <18)
Troponin I (High Sensitivity): 5 ng/L (ref <18)

## 2023-04-23 MED ORDER — ACETAMINOPHEN 325 MG PO TABS
650.0000 mg | ORAL_TABLET | Freq: Once | ORAL | Status: AC
  Administered 2023-04-23: 650 mg via ORAL
  Filled 2023-04-23: qty 2

## 2023-04-23 MED ORDER — SODIUM CHLORIDE 0.9 % IV BOLUS
500.0000 mL | Freq: Once | INTRAVENOUS | Status: AC
  Administered 2023-04-23: 500 mL via INTRAVENOUS

## 2023-04-23 NOTE — ED Provider Notes (Signed)
 [place] EMERGENCY DEPARTMENT AT [facility] Provider Note   CSN: [id number] Arrival date & time: 04/23/23  0827     History  Chief Complaint  Patient presents with   Loss of Consciousness   Sore Throat   Cough   Nasal Congestion   Fever    Michelle Macdonald is a 88 y.o. female.  Patient with history of hyperlipidemia, hypertension, ovarian cancer (2015) --presents to the emergency department today after a syncopal episode at home.  Patient has been feeling poorly over the past 2 days.  She has had sore throat and nasal congestion.  Reports temperature of 99 F at home.  Temperature 100.5 F on arrival.  She was up this morning and preparing some breakfast when she felt "woozy".  She had a syncopal episode.  She struck the back of her head and also complains of tailbone pain.  She was able to sit up and take a few steps after the accident.  She called family member who brought her to the hospital.  No confusion or vomiting.  No headaches.  No chest pain or shortness of breath.      Home Medications Prior to Admission medications   Medication Sig Start Date End Date Taking? Authorizing Provider  acetaminophen (TYLENOL) 500 MG tablet Take 500 mg by mouth every 6 (six) hours as needed for mild pain.    [provider]  amLODipine (NORVASC) 2.5 MG tablet Take 2.5 mg by mouth daily.     [provider]  INVEGA TRINZA 410 MG/1. injection Inject into the muscle. 12/12/21   [provider]  lidocaine (XYLOCAINE) 2 % solution Use as directed 10 mLs in the mouth or throat as needed for mouth pain. 06/23/20   Particia Nearing, PA-C  lurasidone (LATUDA) 20 MG TABS tablet Take 20 mg by mouth daily with breakfast.     [provider]  meclizine (ANTIVERT) 25 MG tablet Take 1 tablet (25 mg total) by mouth 3 (three) times daily as needed for dizziness. 12/16/21   Ellsworth Lennox, PA-C  metFORMIN (GLUCOPHAGE) 500 MG tablet Take 250-500 mg by  mouth 2 (two) times daily with a meal. 500 mg in the morning and 250 mg at bedtime Patient not taking: Reported on 12/16/2021    [provider]  MICROLET LANCETS MISC  01/19/15   [provider]  ondansetron (ZOFRAN) 4 MG tablet Take 1 tablet (4 mg total) by mouth every 6 (six) hours. 12/16/21   Ellsworth Lennox, PA-C      Allergies    Kasandra Knudsen [lurasidone hcl]    Review of Systems   Review of Systems  Physical Exam Updated Vital Signs BP (!) 161/84 (BP Location: Right Arm)   Pulse (!) 102   Temp (!) 100.5 F (38.1 C)   Resp 16   Ht 5\' 1"  (1.549 m)   Wt 66.7 kg   SpO2 99%   BMI 27.78 kg/m  Physical Exam Vitals and nursing note reviewed.  Constitutional:      Appearance: She is well-developed.  HENT:     Head: Normocephalic. No raccoon eyes or Battle's sign.     Comments: Hematoma occipital scalp just left of midline    Right Ear: Tympanic membrane, ear canal and external ear normal. No hemotympanum.     Left Ear: Tympanic membrane, ear canal and external ear normal. No hemotympanum.     Nose: Congestion present.     Mouth/Throat:     Pharynx: Uvula  midline.  Eyes:     General: Lids are normal.     Extraocular Movements:     Right eye: No nystagmus.     Left eye: No nystagmus.     Conjunctiva/sclera: Conjunctivae normal.     Pupils: Pupils are equal, round, and reactive to light.     Comments: No visible hyphema noted  Cardiovascular:     Rate and Rhythm: Normal rate and regular rhythm.  Pulmonary:     Effort: Pulmonary effort is normal.     Breath sounds: Normal breath sounds.  Abdominal:     Palpations: Abdomen is soft.     Tenderness: There is no abdominal tenderness.  Musculoskeletal:     Cervical back: Normal range of motion and neck supple. No tenderness or bony tenderness.     Thoracic back: No tenderness or bony tenderness.     Lumbar back: Tenderness present. No bony tenderness.     Right hip: No tenderness. Normal range of motion.     Left  hip: No tenderness. Normal range of motion.     Comments: Sacral and coccygeal tenderness  Skin:    General: Skin is warm and dry.  Neurological:     Mental Status: She is alert and oriented to person, place, and time.     GCS: GCS eye subscore is 4. GCS verbal subscore is 5. GCS motor subscore is 6.     Cranial Nerves: No cranial nerve deficit.     Sensory: No sensory deficit.     Coordination: Coordination normal.    ED Results / Procedures / Treatments   Labs (all labs ordered are listed, but only abnormal results are displayed) Labs Reviewed  BASIC METABOLIC PANEL - Abnormal; Notable for the following components:      Result Value   Glucose, Bld 197 (*)    Creatinine, Ser 1.13 (*)    GFR, Estimated 48 (*)    Anion gap 3 (*)    All other components within normal limits  URINALYSIS, ROUTINE W REFLEX MICROSCOPIC - Abnormal; Notable for the following components:   Color, Urine STRAW (*)    Leukocytes,Ua LARGE (*)    Bacteria, UA RARE (*)    All other components within normal limits  CBG MONITORING, ED - Abnormal; Notable for the following components:   Glucose-Capillary 178 (*)    All other components within normal limits  RESP PANEL BY RT-PCR (RSV, FLU A&B, COVID)  [id number]  GROUP A STREP BY PCR  CBC  TROPONIN I (HIGH SENSITIVITY)  TROPONIN I (HIGH SENSITIVITY)    EKG EKG Interpretation Date/Time:  Thursday April 23 2023 08:37:00 EDT Ventricular Rate:  101 PR Interval:  120 QRS Duration:  68 QT Interval:  324 QTC Calculation: 420 R Axis:   59  Text Interpretation: Sinus tachycardia Nonspecific ST and T wave abnormality Abnormal ECG When compared with ECG of 20-Feb-2021 10:17,  lateral T wave changes Confirmed by Benjiman Core [id number]) on 04/23/2023 10:26:56 AM  Radiology DG Pelvis 1-2 Views Result Date: 04/23/2023 CLINICAL DATA:  Pelvic pain after fall. EXAM: PELVIS - 1-2 VIEW COMPARISON:  None Available. FINDINGS: There is no evidence of pelvic fracture or  diastasis. No pelvic bone lesions are seen. IMPRESSION: Negative pelvis. Sacrococcygeal exam is recommended if there is clinical concern for coccygeal fracture. Electronically Signed   By: Lupita Raider M.D.   On: 04/23/2023 12:01   DG Chest 2 View Result Date: 04/23/2023 CLINICAL DATA:  Fall. EXAM: CHEST -  2 VIEW COMPARISON:  Jul 06, 2013. FINDINGS: The heart size and mediastinal contours are within normal limits. Both lungs are clear. The visualized skeletal structures are unremarkable. IMPRESSION: No active cardiopulmonary disease. Electronically Signed   By: Lupita Raider M.D.   On: 04/23/2023 11:59   CT Head Wo Contrast Result Date: 04/23/2023 CLINICAL DATA:  Head neck trauma EXAM: CT HEAD WITHOUT CONTRAST CT CERVICAL SPINE WITHOUT CONTRAST TECHNIQUE: Multidetector CT imaging of the head and cervical spine was performed following the standard protocol without intravenous contrast. Multiplanar CT image reconstructions of the cervical spine were also generated. RADIATION DOSE REDUCTION: This exam was performed according to the departmental dose-optimization program which includes automated exposure control, adjustment of the mA and/or kV according to patient size and/or use of iterative reconstruction technique. COMPARISON:  02/20/2021 brain MRI FINDINGS: CT HEAD FINDINGS Brain: No evidence of acute infarction, hemorrhage, hydrocephalus, extra-axial collection or mass lesion/mass effect. Generalized cerebral volume loss. Vascular: No hyperdense vessel or unexpected calcification. Skull: Posterior scalp swelling without acute fracture. Sinuses/Orbits: No acute finding. CT CERVICAL SPINE FINDINGS Alignment: Normal. Skull base and vertebrae: No acute fracture. No primary bone lesion or focal pathologic process. Generalized osteopenia Soft tissues and spinal canal: No prevertebral fluid or swelling. No visible canal hematoma. Disc levels:  Ordinary and generalized degenerative spurring. Upper chest: No  acute finding IMPRESSION: 1. No evidence of acute intracranial or cervical spine injury. 2. Posterior scalp swelling without fracture. Electronically Signed   By: Tiburcio Pea M.D.   On: 04/23/2023 10:40   CT Cervical Spine Wo Contrast Result Date: 04/23/2023 CLINICAL DATA:  Head neck trauma EXAM: CT HEAD WITHOUT CONTRAST CT CERVICAL SPINE WITHOUT CONTRAST TECHNIQUE: Multidetector CT imaging of the head and cervical spine was performed following the standard protocol without intravenous contrast. Multiplanar CT image reconstructions of the cervical spine were also generated. RADIATION DOSE REDUCTION: This exam was performed according to the departmental dose-optimization program which includes automated exposure control, adjustment of the mA and/or kV according to patient size and/or use of iterative reconstruction technique. COMPARISON:  02/20/2021 brain MRI FINDINGS: CT HEAD FINDINGS Brain: No evidence of acute infarction, hemorrhage, hydrocephalus, extra-axial collection or mass lesion/mass effect. Generalized cerebral volume loss. Vascular: No hyperdense vessel or unexpected calcification. Skull: Posterior scalp swelling without acute fracture. Sinuses/Orbits: No acute finding. CT CERVICAL SPINE FINDINGS Alignment: Normal. Skull base and vertebrae: No acute fracture. No primary bone lesion or focal pathologic process. Generalized osteopenia Soft tissues and spinal canal: No prevertebral fluid or swelling. No visible canal hematoma. Disc levels:  Ordinary and generalized degenerative spurring. Upper chest: No acute finding IMPRESSION: 1. No evidence of acute intracranial or cervical spine injury. 2. Posterior scalp swelling without fracture. Electronically Signed   By: Tiburcio Pea M.D.   On: 04/23/2023 10:40    Procedures Procedures    Medications Ordered in ED Medications  acetaminophen (TYLENOL) tablet 650 mg (650 mg Oral Given 04/23/23 0852)  sodium chloride 0.9 % bolus 500 mL (0 mLs  Intravenous Stopped 04/23/23 1126)    ED Course/ Medical Decision Making/ A&P    Patient seen and examined. History obtained directly from patient and family member at bedside.   Labs/EKG: Ordered CBC, BMP, UA, strep and viral panel. Added troponin.   Imaging: Ordered CT head and neck, chest x-ray, pelvis films.  Medications/Fluids: Ordered: IV fluid bolus  Most recent vital signs reviewed and are as follows: BP (!) 161/84 (BP Location: Right Arm)   Pulse (!) 102  Temp (!) 100.5 F (38.1 C)   Resp 16   Ht 5\' 1"  (1.549 m)   Wt 66.7 kg   SpO2 99%   BMI 27.78 kg/m   Initial impression: Syncope in elderly patient, setting of viral URI.  No previous echocardiogram.  12:45 PM Reassessment performed. Patient appears very stable.  Labs personally reviewed and interpreted including: CBC unremarkable; BMP with normal electrolytes, creatinine minimally elevated at 1.13 with a normal BUN, glucose 197; troponin stable at 5; UA with a few white blood cells and clean-catch; strep was negative; viral panel negative.  Imaging personally visualized and interpreted including: CT head and C-spine, agree posterior scalp swelling, no intracranial injury noted, no cervical spine injury noted; chest x-ray was negative for pneumonia; pelvis negative for fracture.  Reviewed pertinent lab work and imaging with patient at bedside. Questions answered.   Most current vital signs reviewed and are as follows: BP (!) 151/73 (BP Location: Right Arm)   Pulse 72   Temp 99.3 F (37.4 C) (Oral)   Resp 18   Ht 5\' 1"  (1.549 m)   Wt 66.7 kg   SpO2 99%   BMI 27.78 kg/m   Plan: Reviewed results with patient at bedside.  We discussed admission versus close outpatient follow-up.  Discussed that sometimes syncope episodes can be caused by irregular heartbeats which would be more concerning.  Offered admission to the hospital.  Discussed that this would be reasonable especially since we do not have a recent  echocardiogram. Patient would rather be followed up closely as an outpatient.  Patient discussed with Dr. Rubin Payor who will see.   1:40 PM Reassessment performed. Patient appears stable. Seen by Dr. Rubin Payor. Cleared for discharge.   Reviewed pertinent lab work and imaging with patient at bedside. Questions answered.   Most current vital signs reviewed and are as follows: BP (!) 155/68 (BP Location: Left Wrist)   Pulse 73   Temp 99.3 F (37.4 C) (Oral)   Resp 16   Ht 5\' 1"  (1.549 m)   Wt 66.7 kg   SpO2 99%   BMI 27.78 kg/m   Plan: Discharge to home.   Prescriptions written for: None  Other home care instructions discussed: Rest, maintain good hydration  ED return instructions discussed: Return and follow-up instructions: I encouraged patient to return to ED with severe chest pain, especially if the pain is crushing or pressure-like and spreads to the arms, back, neck, or jaw, or if they have associated sweating, vomiting, or shortness of breath with the pain, or significant pain with activity. We discussed that the evaluation here today indicates a low-risk of serious cause of chest pain, including heart trouble or a blood clot, but no evaluation is perfect and chest pain can evolve with time. The patient verbalized understanding and agreed.  I encouraged patient to follow-up with their provider in the next 48 hours for recheck.    Follow-up instructions discussed: Patient encouraged to follow-up with their PCP in 2 days.                                   Medical Decision Making Amount and/or Complexity of Data Reviewed Labs: ordered. Radiology: ordered.  Risk OTC drugs.   Syncope: Unclear etiology but in setting of recent viral infection.  Cardiac workup in the ED today was reassuring.  No murmur.  Offered admission, patient declines.  She would benefit from outpatient evaluation  and possible echocardiogram.  Strongly encouraged outpatient follow-up.  EKG with some  lateral T wave changes but no arrhythmia.  Troponins were normal.  Fall: Minor head injury CT negative.  No decompensation during ED stay.  Cervical spine negative.  Tailbone pain: Possible coccygeal contusion versus fracture, negative pelvis x-rays.  Conservative measures indicated.  The patient's vital signs, pertinent lab work and imaging were reviewed and interpreted as discussed in the ED course. Hospitalization was considered for further testing, treatments, or serial exams/observation. However as patient is well-appearing, has a stable exam, and reassuring studies today, I do not feel that they warrant admission at this time. This plan was discussed with the patient who verbalizes agreement and comfort with this plan and seems reliable and able to return to the Emergency Department with worsening or changing symptoms.          Final Clinical Impression(s) / ED Diagnoses Final diagnoses:  Syncope, unspecified syncope type  Minor head injury, initial encounter  Viral respiratory infection    Rx / DC Orders ED Discharge Orders     None         Renne Crigler, PA-C 04/23/23 1342    Benjiman Core, MD 04/23/23 1554

## 2023-04-23 NOTE — ED Triage Notes (Addendum)
 Pt. Stated, I was up fixing me something to eat and I passed out on the floor and hit the back of my head and top of my butt. Ive had a sore throat and nasal congestion for 2 days. pT ALERT AND ORIENTED X 4. Neg. VAN.

## 2023-04-23 NOTE — Discharge Instructions (Addendum)
 Please read and follow all provided instructions.  Your diagnoses today include:  1. Syncope, unspecified syncope type   2. Minor head injury, initial encounter   3. Viral respiratory infection     Tests performed today include: CT scan of your head and neck that did not show any serious injury, does show some bruising of the scalp X-ray of your chest and pelvis which were negative for fracture Blood counts and electrolytes: High blood sugar but were otherwise normal Cardiac enzymes: Were negative EKG without any new or concerning findings Vital signs. See below for your results today.   Medications prescribed:  None  Take any prescribed medications only as directed.  Home care instructions:  Follow any educational materials contained in this packet.  Please use over-the-counter medication as directed on the packaging for headache pain or tailbone pain.  Please continue good fluid intake.  Follow-up instructions: Please follow-up with your primary care provider in the next 2 days for further evaluation of your symptoms.  As we discussed, this is very important for continued workup.  Return instructions:  SEEK IMMEDIATE MEDICAL ATTENTION IF: There is confusion or drowsiness (although children frequently become drowsy after injury).  You cannot awaken the injured person.  You have more than one episode of vomiting.  You notice dizziness or unsteadiness which is getting worse, or inability to walk.  You have convulsions or unconsciousness.  You experience severe, persistent headaches not relieved by Tylenol. You cannot use arms or legs normally.  There are changes in pupil sizes. (This is the black center in the colored part of the eye)  There is clear or bloody discharge from the nose or ears.  You have change in speech, vision, swallowing, or understanding.  Localized weakness, numbness, tingling, or change in bowel or bladder control. You have any other emergent  concerns.  Additional Information: You have had a head injury which does not appear to require admission at this time.  Your vital signs today were: BP (!) 155/68 (BP Location: Left Wrist)   Pulse 73   Temp 99.3 F (37.4 C) (Oral)   Resp 16   Ht 5\' 1"  (1.549 m)   Wt 66.7 kg   SpO2 99%   BMI 27.78 kg/m  If your blood pressure (BP) was elevated above 135/85 this visit, please have this repeated by your doctor within one month. --------------

## 2023-04-23 NOTE — ED Notes (Signed)
 Pt couldn't finish standing orthostatic v\s because she started coughing and couldn't breathe.

## 2023-08-30 ENCOUNTER — Encounter (HOSPITAL_COMMUNITY): Payer: Self-pay | Admitting: Emergency Medicine

## 2023-08-30 ENCOUNTER — Other Ambulatory Visit: Payer: Self-pay

## 2023-08-30 ENCOUNTER — Emergency Department (HOSPITAL_COMMUNITY)
Admission: EM | Admit: 2023-08-30 | Discharge: 2023-08-30 | Disposition: A | Discharge status: Home / Self Care | Attending: Emergency Medicine | Admitting: Emergency Medicine

## 2023-08-30 DIAGNOSIS — R109 Unspecified abdominal pain: Secondary | ICD-10-CM | POA: Diagnosis present

## 2023-08-30 MED ORDER — IBUPROFEN 200 MG PO TABS
400.0000 mg | ORAL_TABLET | Freq: Once | ORAL | Status: AC
  Administered 2023-08-30: 400 mg via ORAL
  Filled 2023-08-30: qty 2

## 2023-08-30 MED ORDER — LIDOCAINE 5 % EX PTCH
1.0000 | MEDICATED_PATCH | CUTANEOUS | Status: DC
  Administered 2023-08-30: 1 via TRANSDERMAL
  Filled 2023-08-30: qty 1

## 2023-08-30 MED ORDER — LIDOCAINE 5 % EX PTCH: 1.0000 | MEDICATED_PATCH | CUTANEOUS | 0 refills | Status: AC

## 2023-08-30 MED ORDER — METHOCARBAMOL 500 MG PO TABS: 500.0000 mg | ORAL_TABLET | Freq: Two times a day (BID) | ORAL | 0 refills | Status: DC

## 2023-08-30 MED ORDER — METHOCARBAMOL 500 MG PO TABS: 500.0000 mg | ORAL_TABLET | Freq: Two times a day (BID) | ORAL | 0 refills | Status: AC

## 2023-08-30 MED ORDER — LIDOCAINE 5 % EX PTCH: 1.0000 | MEDICATED_PATCH | CUTANEOUS | 0 refills | Status: DC

## 2023-08-30 MED ORDER — IBUPROFEN 400 MG PO TABS: 400.0000 mg | ORAL_TABLET | Freq: Four times a day (QID) | ORAL | 0 refills | Status: DC | PRN

## 2023-08-30 MED ORDER — IBUPROFEN 400 MG PO TABS: 400.0000 mg | ORAL_TABLET | Freq: Four times a day (QID) | ORAL | 0 refills | Status: AC | PRN

## 2023-08-30 NOTE — ED Triage Notes (Signed)
 Pt complaining of right flank pain that started 1 week ago. Pt states that she has been taking tylenol  for the pain with little to no relief. Pt was able to driver herself to the hospital. Pt denies any falls or trauma to the area.

## 2023-08-30 NOTE — ED Provider Notes (Signed)
 [place] EMERGENCY DEPARTMENT AT [facility] Provider Note   CSN: [id number] Arrival date & time: 08/30/23  9257     Patient presents with: Flank Pain (Right )   Michelle Macdonald is a 88 y.o. female.   88 year old female presents with 1 week history of atraumatic right-sided flank pain.  Denied any rashes to the area.  Pain is characterized as an ache and worse when she twists her torso or bends over.  Pain does not radiate down her leg or to her abdomen.  She has had no associate urinary symptoms.  Has been using Tylenol  with limited relief.  Denies any midline tenderness.       Prior to Admission medications   Medication Sig Start Date End Date Taking? Authorizing Provider  acetaminophen  (TYLENOL ) 500 MG tablet Take 500 mg by mouth every 6 (six) hours as needed for mild pain.    [provider]  amLODipine  (NORVASC ) 2.5 MG tablet Take 2.5 mg by mouth daily.     [provider]  gabapentin (NEURONTIN) 300 MG capsule Take 300 mg by mouth daily. 03/23/23   [provider]  INVEGA TRINZA 410 MG/1.32ML injection Inject into the muscle. 12/12/21   [provider]  lidocaine  (XYLOCAINE ) 2 % solution Use as directed 10 mLs in the mouth or throat as needed for mouth pain. 06/23/20   Stuart Vernell Norris, PA-C  losartan (COZAAR) 100 MG tablet Take 100 mg by mouth daily. 03/23/23   [provider]  lurasidone (LATUDA) 20 MG TABS tablet Take 20 mg by mouth daily with breakfast.     [provider]  meclizine  (ANTIVERT ) 25 MG tablet Take 1 tablet (25 mg total) by mouth 3 (three) times daily as needed for dizziness. 12/16/21   Lynwood Lenis, PA-C  metFORMIN  (GLUCOPHAGE ) 500 MG tablet Take 250-500 mg by mouth 2 (two) times daily with a meal. 500 mg in the morning and 250 mg at bedtime Patient not taking: Reported on 12/16/2021    [provider]  ondansetron  (ZOFRAN ) 4 MG tablet Take 1 tablet (4 mg total) by mouth every 6  (six) hours. 12/16/21   Lynwood Lenis, PA-C    Allergies: Latuda [lurasidone hcl]    Review of Systems  All other systems reviewed and are negative.   Updated Vital Signs BP (!) 178/84 (BP Location: Right Arm)   Pulse 79   Temp 98.5 F (36.9 C) (Oral)   Resp 16   Ht 1.549 m (5' 1)   Wt 66.7 kg   SpO2 99%   BMI 27.78 kg/m   Physical Exam Vitals and nursing note reviewed.  Constitutional:      General: She is not in acute distress.    Appearance: Normal appearance. She is well-developed. She is not toxic-appearing.  HENT:     Head: Normocephalic and atraumatic.  Eyes:     General: Lids are normal.     Conjunctiva/sclera: Conjunctivae normal.     Pupils: Pupils are equal, round, and reactive to light.  Neck:     Thyroid: No thyroid mass.     Trachea: No tracheal deviation.  Cardiovascular:     Rate and Rhythm: Normal rate and regular rhythm.     Heart sounds: Normal heart sounds. No murmur heard.    No gallop.  Pulmonary:     Effort: Pulmonary effort is normal. No respiratory distress.     Breath sounds: Normal breath sounds. No stridor. No decreased breath sounds, wheezing, rhonchi  or rales.  Abdominal:     General: There is no distension.     Palpations: Abdomen is soft.     Tenderness: There is no abdominal tenderness. There is no rebound.  Musculoskeletal:        General: No tenderness. Normal range of motion.     Cervical back: Normal range of motion and neck supple.       Back:  Skin:    General: Skin is warm and dry.     Findings: No abrasion or rash.  Neurological:     Mental Status: She is alert and oriented to person, place, and time. Mental status is at baseline.     GCS: GCS eye subscore is 4. GCS verbal subscore is 5. GCS motor subscore is 6.     Cranial Nerves: No cranial nerve deficit.     Sensory: No sensory deficit.     Motor: Motor function is intact.  Psychiatric:        Attention and Perception: Attention normal.        Speech: Speech  normal.        Behavior: Behavior normal.     (all labs ordered are listed, but only abnormal results are displayed) Labs Reviewed - No data to display  EKG: None  Radiology: No results found.   Procedures   Medications Ordered in the ED  lidocaine  (LIDODERM ) 5 % 1 patch (has no administration in time range)  ibuprofen  (ADVIL ) tablet 400 mg (has no administration in time range)                                    Medical Decision Making Risk OTC drugs. Prescription drug management.   Patient with MSK pain is reproducible at her right flank.  Pain is worse with certain movements of her torso.  Neurologically intact at this time.  Patient given ibuprofen  and lidocaine  patch here.  Suspect muscle skeletal etiology.  Will place on anti-inflammatories and muscle relaxants and patient follow-up with her doctor.  Return precautions given     Final diagnoses:  None    ED Discharge Orders     None          Dasie Faden, MD 08/30/23 0830

## 2023-08-30 NOTE — Discharge Instructions (Signed)
 Call your doctor tomorrow to schedule a follow-up visit for this week if you are not feeling better.  Return here for any problems

## 2023-12-07 ENCOUNTER — Emergency Department (HOSPITAL_COMMUNITY)

## 2023-12-07 ENCOUNTER — Emergency Department (HOSPITAL_COMMUNITY)
Admission: EM | Admit: 2023-12-07 | Discharge: 2023-12-07 | Disposition: A | Discharge status: Home / Self Care | Attending: Emergency Medicine | Admitting: Emergency Medicine

## 2023-12-07 DIAGNOSIS — S80211A Abrasion, right knee, initial encounter: Secondary | ICD-10-CM | POA: Diagnosis not present

## 2023-12-07 DIAGNOSIS — W19XXXA Unspecified fall, initial encounter: Secondary | ICD-10-CM | POA: Insufficient documentation

## 2023-12-07 DIAGNOSIS — I10 Essential (primary) hypertension: Secondary | ICD-10-CM | POA: Insufficient documentation

## 2023-12-07 DIAGNOSIS — M25562 Pain in left knee: Secondary | ICD-10-CM | POA: Diagnosis present

## 2023-12-07 DIAGNOSIS — M25462 Effusion, left knee: Secondary | ICD-10-CM | POA: Insufficient documentation

## 2023-12-07 DIAGNOSIS — S0083XA Contusion of other part of head, initial encounter: Secondary | ICD-10-CM | POA: Diagnosis not present

## 2023-12-07 DIAGNOSIS — Z8543 Personal history of malignant neoplasm of ovary: Secondary | ICD-10-CM | POA: Insufficient documentation

## 2023-12-07 DIAGNOSIS — S80212A Abrasion, left knee, initial encounter: Secondary | ICD-10-CM | POA: Diagnosis not present

## 2023-12-07 MED ORDER — ACETAMINOPHEN 500 MG PO TABS
1000.0000 mg | ORAL_TABLET | Freq: Once | ORAL | Status: AC
  Administered 2023-12-07: 1000 mg via ORAL
  Filled 2023-12-07: qty 2

## 2023-12-07 NOTE — ED Triage Notes (Addendum)
 Pt BIB by POV. Fell Saturday outside on pavement taking out trash. Split lip and abrasions to both knees. Cites most pain in left knee which is swollen upon observation. No thinners, no LOC

## 2023-12-07 NOTE — Discharge Instructions (Addendum)
 Thank you for coming to [facility] Emergency Department. You were seen for fall and left knee pain. We did an exam, labs, and imaging, and these showed no acute findings.  You can take Tylenol  1,000 mg every 8 hours for pain.  Please rest, ice, and elevate your left knee.  You can use an Ace wrap for compression and support. Please follow up with your primary care provider within 1 week.  You can also follow-up with an orthopedic doctor Dr. Alan in clinic in the next 2 weeks if your symptoms do not improve for consideration of further imaging.  Do not hesitate to return to the ED or call 911 if you experience: -Worsening symptoms -Numbness tingling -Inability to walk -Lightheadedness, passing out -Fevers/chills -Anything else that concerns you

## 2023-12-07 NOTE — ED Provider Notes (Signed)
 [place] EMERGENCY DEPARTMENT AT [facility] Provider Note   CSN: [id number] Arrival date & time: 12/07/23  9140     History  Chief Complaint  Patient presents with   Michelle Macdonald    Michelle Macdonald is a 88 y.o. female with PMH as listed below who presents BIB by POV. Fell Saturday outside on pavement taking out trash. Split lip and abrasions to both knees. Cites most pain in left knee which is swollen upon observation. No thinners, no LOC .  She denies any headache, visual changes, neck pain, chest pain, back pain.  Denies any pelvic pain.  Endorses some mild soreness in the right foot.  Patient has been ambulatory since Saturday but just has pain in the left knee with ambulation.  Otherwise had been in her normal state of health.  Had a mechanical fall when turning to empty the garbage.   Past Medical History:  Diagnosis Date   Arthritis    fingers    Cancer (HCC)    ovarian cancer   Depression    Hyperlipidemia    Hypertension        Home Medications Prior to Admission medications   Medication Sig Start Date End Date Taking? Authorizing Provider  acetaminophen  (TYLENOL ) 500 MG tablet Take 500 mg by mouth every 6 (six) hours as needed for mild pain.    [provider]  amLODipine  (NORVASC ) 2.5 MG tablet Take 2.5 mg by mouth daily.     [provider]  gabapentin (NEURONTIN) 300 MG capsule Take 300 mg by mouth daily. 03/23/23   [provider]  ibuprofen  (ADVIL ) 400 MG tablet Take 1 tablet (400 mg total) by mouth every 6 (six) hours as needed. 08/30/23   Dasie Faden, MD  INVEGA TRINZA 410 MG/1.32ML injection Inject into the muscle. 12/12/21   [provider]  lidocaine  (LIDODERM ) 5 % Place 1 patch onto the skin daily. Remove & Discard patch within 12 hours or as directed by MD 08/30/23   Dasie Faden, MD  lidocaine  (XYLOCAINE ) 2 % solution Use as directed 10 mLs in the mouth or throat as needed for mouth pain. 06/23/20   Stuart Vernell Norris, PA-C  losartan (COZAAR) 100 MG tablet Take 100 mg by mouth daily. 03/23/23   [provider]  lurasidone (LATUDA) 20 MG TABS tablet Take 20 mg by mouth daily with breakfast.     [provider]  meclizine  (ANTIVERT ) 25 MG tablet Take 1 tablet (25 mg total) by mouth 3 (three) times daily as needed for dizziness. 12/16/21   Lynwood Lenis, PA-C  metFORMIN  (GLUCOPHAGE ) 500 MG tablet Take 250-500 mg by mouth 2 (two) times daily with a meal. 500 mg in the morning and 250 mg at bedtime Patient not taking: Reported on 12/16/2021    [provider]  methocarbamol  (ROBAXIN ) 500 MG tablet Take 1 tablet (500 mg total) by mouth 2 (two) times daily. 08/30/23   Dasie Faden, MD  ondansetron  (ZOFRAN ) 4 MG tablet Take 1 tablet (4 mg total) by mouth every 6 (six) hours. 12/16/21   Lynwood Lenis, PA-C      Allergies    Oleta [lurasidone hcl]    Review of Systems   Review of Systems A 10 point review of systems was performed and is negative unless otherwise reported in HPI.  Physical Exam Updated Vital Signs BP (!) 178/90 (BP Location: Left Arm)   Pulse (!) 101   Temp 98.2 F (36.8 C) (Oral)  Resp 16   Ht 5' 1 (1.549 m)   Wt 66.7 kg   SpO2 99%   BMI 27.78 kg/m  Physical Exam General: Normal appearing female, lying in bed.  HEENT: NCAT, PERRLA, Sclera anicteric, MMM, trachea midline.  Mild abrasion noted to the tip of the nose and mild contusion noted to the upper lip.  No facial abrasions.  No forehead, midface, nasal bridge instability.  No new missing or fractured teeth. Cardiology: RRR, no murmurs/rubs/gallops.  Resp: Normal respiratory rate and effort. CTAB, no wheezes, rhonchi, crackles.  Abd: Soft, non-tender, non-distended. No rebound tenderness or guarding.  Pelvis stable nontender. MSK: Mild effusion noted to the left knee without any erythema, induration, fluctuance.  Minimal pain with passive range of motion.  Some pain with active range of  motion.  Negative anterior/posterior drawer.  Intact flexion extension.  No tenderness elation to the right foot, no tenderness at the fifth metatarsal on the right foot.  Mild abrasions noted to bilateral knees that are scabbed over.  NVI in bilateral lower extremities. Skin: warm, dry.  Back: No CVA tenderness.  No C-spine tenderness ovation deformities or step-offs. Neuro: A&Ox4, CNs II-XII grossly intact. MAEs. Sensation grossly intact.  Psych: Normal mood and affect.   ED Results / Procedures / Treatments   Labs (all labs ordered are listed, but only abnormal results are displayed) Labs Reviewed - No data to display  EKG None  Radiology DG Knee Complete 4 Views Left Result Date: 12/07/2023 EXAM: 4 OR MORE VIEW(S) XRAY OF THE LEFT KNEE 12/07/2023 09:32:00 AM COMPARISON: Left knee radiographs 09/23/2015. CLINICAL HISTORY: Left knee pain, swelling, and abrasions after a fall. No thinners or LOC. FINDINGS: BONES AND JOINTS: No acute fracture. No focal osseous lesion. No joint dislocation. Chronic joint effusion. Moderate medial and mild lateral compartment joint space narrowing have progressed. Osteophyte formation consistent with osteoarthritis. Vascular calcifications. SOFT TISSUES: The soft tissues are unremarkable. IMPRESSION: 1. No acute fracture or dislocation identified about the left knee. 2. Knee joint effusion appears chronic. Progression of osteoarthritis since 2017. Electronically signed by: Helayne Hurst MD 12/07/2023 09:50 AM EDT RP Workstation: [id number]   CT Head Wo Contrast Result Date: 12/07/2023 EXAM: CT HEAD WITHOUT CONTRAST 12/07/2023 09:40:09 AM TECHNIQUE: CT of the head was performed without the administration of intravenous contrast. Automated exposure control, iterative reconstruction, and/or weight-based adjustment of the mA/kV was utilized to reduce the radiation dose to as low as reasonably achievable. COMPARISON: Brain MRI on 02/20/2021, and head CT on 04/23/2023.  CLINICAL HISTORY: 88 Year Old Female, Facial Trauma. FINDINGS: BRAIN AND VENTRICLES: No acute hemorrhage. No evidence of acute infarct. No hydrocephalus. No extra-axial collection. No mass effect or midline shift. Brain volume is stable and normal for age. Gray-white differentiation is stable and within normal limits for age. ORBITS: No acute abnormality. SINUSES: No acute abnormality. SOFT TISSUES AND SKULL: Left posterior scalp convexity scarring, corresponding in part to the site of scalp hematoma in March. No acute orbit or scalp soft tissue injury identified. IMPRESSION: 1. No acute intracranial hemorrhage or mass effect. 2. Stable and normal for age non contrast CT appearance of the brain. Electronically signed by: Helayne Hurst MD 12/07/2023 09:48 AM EDT RP Workstation: [id number]    Procedures Procedures    Medications Ordered in ED Medications  acetaminophen  (TYLENOL ) tablet 1,000 mg (1,000 mg Oral Given 12/07/23 9046)    ED Course/ Medical Decision Making/ A&P  Medical Decision Making Amount and/or Complexity of Data Reviewed Radiology: ordered.  Risk OTC drugs.    This patient presents to the ED for concern of fall at home, facial contusion, left knee pain/effusion, this involves an extensive number of treatment options, and is a complaint that carries with it a high risk of complications and morbidity.  I considered the following differential and admission for this acute, potentially life threatening condition.  Patient is overall extremely well-appearing and hemodynamically stable.  MDM:    Patient with mechanical fall, mild contusion to the face, traumatic left knee pain.  No lacerations on the face.  Patient is alert and oriented x 4, no LOC and no blood thinners, no headache.  CT head ordered from triage is negative.  Mild left knee effusion noted on exam.  Intact flexion as well as extension.  No knee instability that would indicate a severe  ligamentous or tendon rupture.  Patient has been ambulatory.  X-rays negative.  Low concern for occult fracture including tibial plateau fracture.  X-ray actually notes that effusion appears chronic, also consider flare of osteoarthritis.  Patient is offered a knee immobilizer but states she would rather not use 1.  Given Tylenol  as well as an Ace wrap and instructed for RICE, Tylenol  1000 mg every 8 hours, and follow-up with orthopedic surgery in 2 weeks if her symptoms do not improve.     Imaging Studies ordered: Left knee x-ray and CT head ordered from triage I independently visualized and interpreted imaging. I agree with the radiologist interpretation  Additional history obtained from chart review  Social Determinants of Health:  lives independently  Disposition:  DC w/ discharge instructions/return precautions. All questions answered to patient's satisfaction.    Co morbidities that complicate the patient evaluation  Past Medical History:  Diagnosis Date   Arthritis    fingers    Cancer (HCC)    ovarian cancer   Depression    Hyperlipidemia    Hypertension      Medicines Meds ordered this encounter  Medications   acetaminophen  (TYLENOL ) tablet 1,000 mg    I have reviewed the patients home medicines and have made adjustments as needed  Problem List / ED Course: Problem List Items Addressed This Visit   None Visit Diagnoses       Fall in home, initial encounter    -  Primary     Contusion of face, initial encounter         Acute pain of left knee                       This note was created using dictation software, which may contain spelling or grammatical errors.    Franklyn Sid SAILOR, MD 12/07/23 1005

## 2023-12-09 ENCOUNTER — Encounter (INDEPENDENT_AMBULATORY_CARE_PROVIDER_SITE_OTHER): Admitting: Ophthalmology

## 2023-12-09 NOTE — Progress Notes (Addendum)
 [facility] - Clinic Note  12/21/2023   CHIEF COMPLAINT Patient presents for Retina Evaluation  HISTORY OF PRESENT ILLNESS: Michelle Macdonald is a 88 y.o. female who presents to the clinic today for:  HPI     Retina Evaluation   In both eyes.  I, the attending physician,  performed the HPI with the patient and updated documentation appropriately.        Comments   Patient here for Retina Evaluation. Referred by Dr Octavia. Patient states vision doing good. No eye pain. Dr Octavia saw something in OD.       Last edited by Valdemar Rogue, MD on 12/27/2023  2:07 AM.    Patient feels her vision is stable. She is unable to travel to see another physician due to lack of transportation.   Referring physician: Octavia Charlie Hamilton, MD [address] [place],  [place] [address]  HISTORICAL INFORMATION:  Selected notes from the MEDICAL RECORD NUMBER Referred by Dr. CANDIE Octavia LEE:  Ocular Hx- PMH-   CURRENT MEDICATIONS: No current outpatient medications on file. (Ophthalmic Drugs)   No current facility-administered medications for this visit. (Ophthalmic Drugs)   Current Outpatient Medications (Other)  Medication Sig   amLODipine  (NORVASC ) 2.5 MG tablet Take 2.5 mg by mouth daily.    gabapentin (NEURONTIN) 300 MG capsule Take 300 mg by mouth daily.   INVEGA TRINZA 410 MG/1.32ML injection Inject into the muscle.   losartan (COZAAR) 100 MG tablet Take 100 mg by mouth daily.   acetaminophen  (TYLENOL ) 500 MG tablet Take 500 mg by mouth every 6 (six) hours as needed for mild pain. (Patient not taking: Reported on 12/21/2023)   ibuprofen  (ADVIL ) 400 MG tablet Take 1 tablet (400 mg total) by mouth every 6 (six) hours as needed. (Patient not taking: Reported on 12/21/2023)   lidocaine  (LIDODERM ) 5 % Place 1 patch onto the skin daily. Remove & Discard patch within 12 hours or as directed by MD (Patient not taking: Reported on 12/21/2023)   lidocaine  (XYLOCAINE ) 2 %  solution Use as directed 10 mLs in the mouth or throat as needed for mouth pain. (Patient not taking: Reported on 12/21/2023)   lurasidone (LATUDA) 20 MG TABS tablet Take 20 mg by mouth daily with breakfast.  (Patient not taking: Reported on 12/21/2023)   meclizine  (ANTIVERT ) 25 MG tablet Take 1 tablet (25 mg total) by mouth 3 (three) times daily as needed for dizziness. (Patient not taking: Reported on 12/21/2023)   metFORMIN  (GLUCOPHAGE ) 500 MG tablet Take 250-500 mg by mouth 2 (two) times daily with a meal. 500 mg in the morning and 250 mg at bedtime (Patient not taking: Reported on 12/21/2023)   methocarbamol  (ROBAXIN ) 500 MG tablet Take 1 tablet (500 mg total) by mouth 2 (two) times daily. (Patient not taking: Reported on 12/21/2023)   ondansetron  (ZOFRAN ) 4 MG tablet Take 1 tablet (4 mg total) by mouth every 6 (six) hours. (Patient not taking: Reported on 12/21/2023)   No current facility-administered medications for this visit. (Other)   REVIEW OF SYSTEMS: ROS   Positive for: Cardiovascular, Eyes Last edited by Orval Asberry RAMAN, COA on 12/21/2023  1:13 PM.     ALLERGIES Allergies  Allergen Reactions   Oleta [Lurasidone Hcl] Other (See Comments)    Headache   PAST MEDICAL HISTORY Past Medical History:  Diagnosis Date   Arthritis    fingers    Cancer (HCC)    ovarian cancer  Depression    Hyperlipidemia    Hypertension    Past Surgical History:  Procedure Laterality Date   ROBOTIC ASSISTED TOTAL HYSTERECTOMY WITH BILATERAL SALPINGO OOPHERECTOMY Bilateral 07/19/2013   Procedure: ROBOTIC ASSISTED TOTAL HYSTERECTOMY WITH BILATERAL SALPINGO OOPHORECTOMY and lymph node dissecton bilateral;  Surgeon: Sari Bachelor, MD;  Location: [facility];  Service: Gynecology;  Laterality: Bilateral;   FAMILY HISTORY Family History  Problem Relation Age of Onset   Heart disease Mother    Heart disease Sister    SOCIAL HISTORY Social History   Tobacco Use   Smoking status: Never    Smokeless tobacco: Never  Vaping Use   Vaping status: Never Used  Substance Use Topics   Alcohol  use: No   Drug use: No       OPHTHALMIC EXAM:  Base Eye Exam     Visual Acuity (Snellen - Linear)       Right Left   Dist cc 20/20 -2 20/20    Correction: Glasses         Tonometry (Tonopen, 1:10 PM)       Right Left   Pressure 21 22         Pupils       Dark Light Shape React APD   Right 2 1 Round Brisk None   Left 2 1 Round Brisk None         Visual Fields (Counting fingers)       Left Right    Full Full         Extraocular Movement       Right Left    Full, Ortho Full, Ortho         Neuro/Psych     Oriented x3: Yes   Mood/Affect: Normal         Dilation     Both eyes: 1.0% Mydriacyl, 2.5% Phenylephrine  @ 1:10 PM           Slit Lamp and Fundus Exam     External Exam       Right Left   External Normal Normal         Slit Lamp Exam       Right Left   Lids/Lashes Dermatochalasis - upper lid Dermatochalasis - upper lid, Meibomian gland dysfunction   Conjunctiva/Sclera White and quiet White and quiet   Cornea Arcus, 1-2+ Punctate epithelial erosions Arcus   Anterior Chamber Deep, narrow temporal angle Deep, narrow temporal angle   Iris Round and moderately dilated, mild anterior bowing Round and moderately dilated, mild anterior bowing   Lens 2+ Nuclear sclerosis, 2-3+ Cortical cataract 2+ Nuclear sclerosis, 2-3+ Cortical cataract   Anterior Vitreous Posterior vitreous detachment Normal         Fundus Exam       Right Left   Disc Pink and sharp, With cupping Pink and sharp, With cupping   C/D Ratio 0.75 0.8   Macula Flat, Good foveal reflex, No heme or edema Flat, Good foveal reflex, No heme or edema   Vessels Vascular attenuation, Tortuous Vascular attenuation, Tortuous   Periphery Attached, No heme, elevated choroidal mass temporal periphery 0900-1030 with surrounding exudates greatest anteriorly Attached, drusen,  peripheral exudation far temporal periphery           Refraction     Wearing Rx       Sphere Cylinder Add   Right +0.25 Sphere +2.50   Left +0.50 Sphere +2.50           IMAGING  AND PROCEDURES  Imaging and Procedures for 12/21/2023  OCT, Retina - OU - Both Eyes       Right Eye Quality was good. Central Foveal Thickness: 266. Progression has no prior data. Findings include normal foveal contour, no IRF, no SRF, pigment epithelial detachment (Dome shaped PED vs choroidal mass temporal periphery caught on widefield).   Left Eye Quality was good. Central Foveal Thickness: 283. Progression has no prior data. Findings include normal foveal contour, no IRF, no SRF, pigment epithelial detachment (Focal low PED nasal macula).   Notes *Images captured and stored on drive  Diagnosis / Impression:  OD: Dome shaped PED vs choroidal mass temporal periphery caught on widefield OS: Focal low PED nasal macula OU: no DME  Clinical management:  See below  Abbreviations: NFP - Normal foveal profile. CME - cystoid macular edema. PED - pigment epithelial detachment. IRF - intraretinal fluid. SRF - subretinal fluid. EZ - ellipsoid zone. ERM - epiretinal membrane. ORA - outer retinal atrophy. ORT - outer retinal tubulation. SRHM - subretinal hyper-reflective material. IRHM - intraretinal hyper-reflective material      Color Fundus Photography Optos - OU - Both Eyes       Right Eye Progression has no prior data. Disc findings include increased cup to disc ratio. Macula : normal observations. Vessels : normal observations. Periphery : exudates, mass, RPE abnormality (Large elevation/PED temporal periphery with surrounding exudates/RPE atrophy).   Left Eye Progression has no prior data. Disc findings include normal observations. Macula : normal observations. Vessels : normal observations. Periphery : normal observations.   Notes *Images captured and stored on drive  Diagnosis /  Impression:  OD: Large elevation/PED temporal periphery with surrounding exudates/RPE atrophy OS: normal  Clinical management:  See below  Abbreviations: NFP - Normal foveal profile. CME - cystoid macular edema. PED - pigment epithelial detachment. IRF - intraretinal fluid. SRF - subretinal fluid. EZ - ellipsoid zone. ERM - epiretinal membrane. ORA - outer retinal atrophy. ORT - outer retinal tubulation. SRHM - subretinal hyper-reflective material. IRHM - intraretinal hyper-reflective material           ASSESSMENT/PLAN:   ICD-10-CM   1. Choroidal lesion  H31.8     2. 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 Diabetes mellitus treated with oral medication (HCC)  E11.9    Z79.84     4. Diabetes mellitus without complication (HCC)  E11.9     5. Hypertension, essential  I10     6. Hypertensive retinopathy of both eyes  H35.033     7. Combined forms of age-related cataract of both eyes  H25.813      1,2. Choroidal mass vs. PED, OD  - referred by Dr. Octavia for suspicious lesion found incidentally on routine exam  - pt asymptomatic and BCVA is 20/20 OU - exam shows elevated choroidal mass temporal periphery 0900-1030 with surrounding exudates greatest anteriorly - OCT shows OD: Dome shaped PED vs choroidal mass temporal periphery caught on widefield, no DME - Optos color photos OD: Large elevation/PED temporal periphery with surrounding exudates/RPE atrophy - discussed findings - recommend referral to [facility] for further evaluation and management - pt states that she is unable to go due to transportation  - discussed potential malignancy and risk of additional morbidity / mortality -- pt verbalized understanding and decline further work up -  f/u in 6 weeks, DFE,OCT - pt states she can not come due to tremors and anxiety, lack of transportation, and living  alone  3,4.  Diabetes mellitus, type 2 without retinopathy - The incidence, risk factors for progression, natural history and treatment options for diabetic retinopathy  were discussed with patient.   - The need for close monitoring of blood glucose, blood pressure, and serum lipids, avoiding cigarette or any type of tobacco, and the need for long term follow up was also discussed with patient. - OCT shows No DME OU - BCVA OU 20/20  5,6. Hypertensive retinopathy OU - discussed importance of tight BP control - monitor   7. Mixed Cataract OU - The symptoms of cataract, surgical options, and treatments and risks were discussed with patient. - discussed diagnosis and progression - under the expert care of Dr. CANDIE Gaudy - has appt with Dr. Gaudy 11. 2026 - monitor   Ophthalmic Meds Ordered this visit:  No orders of the defined types were placed in this encounter.    Return in about 6 weeks (around 02/01/2024) for f/u choroidal mass OD, DFE, OCT - pt states she is unable to come due to many issues.  There are no Patient Instructions on file for this visit.  Explained the diagnoses, plan, and follow up with the patient and they expressed understanding.  Patient expressed understanding of the importance of proper follow up care.   This document serves as a record of services personally performed by Redell JUDITHANN Hans, MD, PhD. It was created on their behalf by Avelina Pereyra, COA an ophthalmic technician. The creation of this record is the provider's dictation and/or activities during the visit.   Electronically signed by: Avelina GORMAN Pereyra, COT  12/27/23  2:15 AM   This document serves as a record of services personally performed by Redell JUDITHANN Hans, MD, PhD. It was created on their behalf by Wanda GEANNIE Keens, COT an ophthalmic technician. The creation of this record is the provider's dictation and/or activities during the visit.    Electronically signed by:  Wanda GEANNIE Keens, COT  12/27/23  2:15 AM  Redell JUDITHANN Hans, M.D., Ph.D. Diseases & Surgery of the Retina and Vitreous [facility] 12/21/2023  I have reviewed the above documentation for accuracy and completeness, and I agree with the above. Redell JUDITHANN Hans, M.D., Ph.D. 12/27/23 2:15 AM   Abbreviations: M myopia (nearsighted); A astigmatism; H hyperopia (farsighted); P presbyopia; Mrx spectacle prescription;  CTL contact lenses; OD right eye; OS left eye; OU both eyes  XT exotropia; ET esotropia; PEK punctate epithelial keratitis; PEE punctate epithelial erosions; DES dry eye syndrome; MGD meibomian gland dysfunction; ATs artificial tears; PFAT's preservative free artificial tears; NSC nuclear sclerotic cataract; PSC posterior subcapsular cataract; ERM epi-retinal membrane; PVD posterior vitreous detachment; RD retinal detachment; DM diabetes mellitus; DR diabetic retinopathy; NPDR non-proliferative diabetic retinopathy; PDR proliferative diabetic retinopathy; CSME clinically significant macular edema; DME diabetic macular edema; dbh dot blot hemorrhages; CWS cotton wool spot; POAG primary open angle glaucoma; C/D cup-to-disc ratio; HVF humphrey visual field; GVF goldmann visual field; OCT optical coherence tomography; IOP intraocular pressure; BRVO Branch retinal vein occlusion; CRVO central retinal vein occlusion; CRAO central retinal artery occlusion; BRAO branch retinal artery occlusion; RT retinal tear; SB scleral buckle; PPV pars plana vitrectomy; VH Vitreous hemorrhage; PRP panretinal laser photocoagulation; IVK intravitreal kenalog; VMT vitreomacular traction; MH Macular hole;  NVD neovascularization of the disc; NVE neovascularization elsewhere; AREDS age related eye  disease study; ARMD age related macular degeneration; POAG primary open angle glaucoma; EBMD epithelial/anterior basement membrane dystrophy; ACIOL anterior chamber intraocular lens; IOL intraocular lens; PCIOL posterior chamber intraocular lens;  Phaco/IOL phacoemulsification with intraocular lens placement; PRK photorefractive keratectomy; LASIK laser assisted in situ keratomileusis; HTN hypertension; DM diabetes mellitus; COPD chronic obstructive pulmonary disease

## 2023-12-21 ENCOUNTER — Ambulatory Visit (INDEPENDENT_AMBULATORY_CARE_PROVIDER_SITE_OTHER): Admitting: Ophthalmology

## 2023-12-21 ENCOUNTER — Encounter (INDEPENDENT_AMBULATORY_CARE_PROVIDER_SITE_OTHER): Payer: Self-pay | Admitting: Ophthalmology

## 2023-12-21 DIAGNOSIS — H25813 Combined forms of age-related cataract, bilateral: Secondary | ICD-10-CM

## 2023-12-21 DIAGNOSIS — H35721 Serous detachment of retinal pigment epithelium, right eye: Secondary | ICD-10-CM | POA: Diagnosis not present

## 2023-12-21 DIAGNOSIS — E119 Type 2 diabetes mellitus without complications: Secondary | ICD-10-CM | POA: Diagnosis not present

## 2023-12-21 DIAGNOSIS — Z7984 Long term (current) use of oral hypoglycemic drugs: Secondary | ICD-10-CM

## 2023-12-21 DIAGNOSIS — I10 Essential (primary) hypertension: Secondary | ICD-10-CM

## 2023-12-21 DIAGNOSIS — H318 Other specified disorders of choroid: Secondary | ICD-10-CM

## 2023-12-21 DIAGNOSIS — H35033 Hypertensive retinopathy, bilateral: Secondary | ICD-10-CM

## 2023-12-21 DIAGNOSIS — H3581 Retinal edema: Secondary | ICD-10-CM

## 2023-12-27 ENCOUNTER — Encounter (INDEPENDENT_AMBULATORY_CARE_PROVIDER_SITE_OTHER): Payer: Self-pay | Admitting: Ophthalmology
